# Patient Record
Sex: Female | Born: 1950 | Race: White | Hispanic: No | Marital: Married | Smoking: Never smoker
Health system: Southern US, Community
[De-identification: ages and names within clinical notes are randomized; demographics above are authoritative.]

## PROBLEM LIST (undated history)

## (undated) DIAGNOSIS — E785 Hyperlipidemia, unspecified: Secondary | ICD-10-CM

## (undated) DIAGNOSIS — I10 Essential (primary) hypertension: Secondary | ICD-10-CM

## (undated) DIAGNOSIS — E119 Type 2 diabetes mellitus without complications: Secondary | ICD-10-CM

## (undated) HISTORY — DX: Hyperlipidemia, unspecified: E78.5

## (undated) HISTORY — DX: Essential (primary) hypertension: I10

## (undated) HISTORY — DX: Type 2 diabetes mellitus without complications: E11.9

## (undated) HISTORY — PX: TUBAL LIGATION: SHX77

---

## 2006-12-07 LAB — HM COLONOSCOPY: HM Colonoscopy: NORMAL

## 2006-12-19 ENCOUNTER — Ambulatory Visit: Payer: Self-pay | Admitting: Internal Medicine

## 2006-12-28 ENCOUNTER — Ambulatory Visit: Payer: Self-pay | Admitting: Internal Medicine

## 2007-01-01 ENCOUNTER — Encounter: Admission: RE | Admit: 2007-01-01 | Discharge: 2007-01-01 | Payer: Self-pay | Admitting: Obstetrics and Gynecology

## 2007-05-09 LAB — HM DEXA SCAN

## 2010-01-06 LAB — HM PAP SMEAR: HM Pap smear: NORMAL

## 2010-01-06 LAB — HM MAMMOGRAPHY: HM Mammogram: NORMAL

## 2010-11-18 ENCOUNTER — Encounter: Payer: Self-pay | Admitting: Family Medicine

## 2010-11-18 DIAGNOSIS — E785 Hyperlipidemia, unspecified: Secondary | ICD-10-CM | POA: Insufficient documentation

## 2010-11-18 DIAGNOSIS — I10 Essential (primary) hypertension: Secondary | ICD-10-CM | POA: Insufficient documentation

## 2012-01-22 ENCOUNTER — Ambulatory Visit
Admission: RE | Admit: 2012-01-22 | Discharge: 2012-01-22 | Disposition: A | Discharge status: Home / Self Care | Payer: BC Managed Care – PPO | Source: Ambulatory Visit | Attending: Family Medicine | Admitting: Family Medicine

## 2012-01-22 ENCOUNTER — Other Ambulatory Visit: Payer: Self-pay | Admitting: Family Medicine

## 2012-01-22 DIAGNOSIS — M25561 Pain in right knee: Secondary | ICD-10-CM

## 2012-07-29 ENCOUNTER — Ambulatory Visit (INDEPENDENT_AMBULATORY_CARE_PROVIDER_SITE_OTHER): Payer: BC Managed Care – PPO | Admitting: Family Medicine

## 2012-07-29 ENCOUNTER — Encounter: Payer: Self-pay | Admitting: Family Medicine

## 2012-07-29 VITALS — BP 138/70 | HR 60 | Temp 98.2°F | Resp 18 | Wt 157.0 lb

## 2012-07-29 DIAGNOSIS — J209 Acute bronchitis, unspecified: Secondary | ICD-10-CM

## 2012-07-29 MED ORDER — AZITHROMYCIN 250 MG PO TABS: ORAL_TABLET | ORAL | Status: DC

## 2012-07-29 MED ORDER — PREDNISONE 20 MG PO TABS: ORAL_TABLET | ORAL | Status: DC

## 2012-07-29 NOTE — Progress Notes (Signed)
Subjective:     Patient ID: Katie Lutz, female   DOB: 01-21-1951, 62 y.o.   MRN: [id number]  HPI  One week of worsening cough, chest congestion, head congestion, and shortness of breath.  He denies fevers.  Cough is productive of yellow sputum.  She is now audibly wheezing and has developed pleurisy.  She denies hemoptysis.  Denies anginal chest pain.   She has no history of asthma or smoking. She denies sore throat or otalgia. Past Medical History  Diagnosis Date  . Hyperlipidemia   . Hypertension    Current Outpatient Prescriptions on File Prior to Visit  Medication Sig Dispense Refill  . atenolol (TENORMIN) 25 MG tablet Take 25 mg by mouth daily.        . calcium carbonate 200 MG capsule Take 250 mg by mouth daily.        . hydrochlorothiazide 25 MG tablet Take 25 mg by mouth daily.        . Multiple Vitamins-Minerals (MULTIVITAMIN,TX-MINERALS) tablet Take 1 tablet by mouth daily.        . pravastatin (PRAVACHOL) 20 MG tablet Take 20 mg by mouth daily.         No current facility-administered medications on file prior to visit.    Review of Systems  Constitutional: Positive for chills and fatigue. Negative for fever.  HENT: Positive for congestion, rhinorrhea and postnasal drip. Negative for ear pain, neck pain and neck stiffness.   Respiratory: Positive for cough, chest tightness, shortness of breath and wheezing.   Cardiovascular: Negative for chest pain.  Gastrointestinal: Negative.        Objective:   Physical Exam  Constitutional: She appears well-developed and well-nourished.  HENT:  Head: Normocephalic.  Right Ear: External ear normal.  Left Ear: External ear normal.  Mouth/Throat: Oropharynx is clear and moist.  Eyes: Conjunctivae and EOM are normal. Pupils are equal, round, and reactive to light.  Neck: Normal range of motion. Neck supple.  Cardiovascular: Normal rate, regular rhythm and normal heart sounds.   Pulmonary/Chest: Effort normal. She has  wheezes. She has no rales.  Abdominal: Soft. Bowel sounds are normal.       Assessment:     Acute bronchitis with reactive airway disease    Plan:     Acute bronchitis - Plan: azithromycin (ZITHROMAX Z-PAK) 250 MG tablet  take as directed.  Also gave her Ventolin 2 puffs inhaled every 4 hours when necessary wheezing or shortness of breath.Marland Kitchen  also gave her prednisone 20 mg tablets she is to take 3 tablets on days 1 and 2, 2 tablets on days 3 and 4, 1 tablet on days 5 and 6. Recheck in one week sooner if worse.

## 2012-08-21 ENCOUNTER — Telehealth: Payer: Self-pay | Admitting: Family Medicine

## 2012-08-21 MED ORDER — PRAVASTATIN SODIUM 20 MG PO TABS: 20.0000 mg | ORAL_TABLET | Freq: Every day | ORAL | Status: DC

## 2012-08-21 NOTE — Telephone Encounter (Signed)
Rx Refilled  

## 2012-09-30 ENCOUNTER — Telehealth: Payer: Self-pay | Admitting: Family Medicine

## 2012-10-01 MED ORDER — CELECOXIB 200 MG PO CAPS: 200.0000 mg | ORAL_CAPSULE | Freq: Every day | ORAL | Status: DC

## 2012-10-01 NOTE — Telephone Encounter (Signed)
Rx Refilled  

## 2012-10-07 ENCOUNTER — Telehealth: Payer: Self-pay | Admitting: Family Medicine

## 2012-10-07 MED ORDER — LOSARTAN POTASSIUM 50 MG PO TABS: 50.0000 mg | ORAL_TABLET | Freq: Every day | ORAL | Status: DC

## 2012-10-07 NOTE — Telephone Encounter (Signed)
Losartan 50mg  take one tablet by mouth qd #30 last refill 09/08/12

## 2012-10-07 NOTE — Telephone Encounter (Signed)
Rx Refilled  

## 2012-10-30 ENCOUNTER — Telehealth: Payer: Self-pay | Admitting: Family Medicine

## 2012-10-30 MED ORDER — CELECOXIB 200 MG PO CAPS: 200.0000 mg | ORAL_CAPSULE | Freq: Every day | ORAL | Status: DC

## 2012-10-30 NOTE — Telephone Encounter (Signed)
Rx Refilled  

## 2013-02-03 ENCOUNTER — Other Ambulatory Visit: Payer: Self-pay | Admitting: Family Medicine

## 2013-02-09 ENCOUNTER — Other Ambulatory Visit: Payer: Self-pay | Admitting: Family Medicine

## 2013-03-14 ENCOUNTER — Other Ambulatory Visit: Payer: Self-pay | Admitting: Nurse Practitioner

## 2013-04-17 ENCOUNTER — Other Ambulatory Visit: Payer: Self-pay | Admitting: Family Medicine

## 2013-04-26 ENCOUNTER — Other Ambulatory Visit: Payer: Self-pay | Admitting: Nurse Practitioner

## 2013-04-26 ENCOUNTER — Other Ambulatory Visit: Payer: Self-pay | Admitting: Family Medicine

## 2013-05-22 ENCOUNTER — Other Ambulatory Visit: Payer: Self-pay | Admitting: Family Medicine

## 2013-05-23 ENCOUNTER — Other Ambulatory Visit: Payer: Self-pay | Admitting: Family Medicine

## 2013-05-23 ENCOUNTER — Encounter: Payer: Self-pay | Admitting: Family Medicine

## 2013-05-23 NOTE — Telephone Encounter (Signed)
Medication refill for one time only.  Patient needs to be seen.  Letter sent for patient to call and schedule 

## 2013-06-19 ENCOUNTER — Encounter: Payer: Self-pay | Admitting: Family Medicine

## 2013-06-19 ENCOUNTER — Other Ambulatory Visit: Payer: Self-pay | Admitting: Family Medicine

## 2013-06-19 NOTE — Telephone Encounter (Signed)
Medication refill for one time only.  Patient needs to be seen.  Letter sent for patient to call and schedule 

## 2013-07-14 ENCOUNTER — Ambulatory Visit
Admission: RE | Admit: 2013-07-14 | Discharge: 2013-07-14 | Disposition: A | Discharge status: Home / Self Care | Payer: Medicare Other | Source: Ambulatory Visit | Attending: Family Medicine | Admitting: Family Medicine

## 2013-07-14 ENCOUNTER — Encounter: Payer: Self-pay | Admitting: Family Medicine

## 2013-07-14 ENCOUNTER — Ambulatory Visit (INDEPENDENT_AMBULATORY_CARE_PROVIDER_SITE_OTHER): Payer: Medicare Other | Admitting: Family Medicine

## 2013-07-14 VITALS — BP 128/72 | HR 60 | Temp 97.4°F | Resp 16 | Ht 60.0 in | Wt 156.0 lb

## 2013-07-14 DIAGNOSIS — R05 Cough: Secondary | ICD-10-CM

## 2013-07-14 DIAGNOSIS — R0989 Other specified symptoms and signs involving the circulatory and respiratory systems: Secondary | ICD-10-CM | POA: Diagnosis not present

## 2013-07-14 DIAGNOSIS — R059 Cough, unspecified: Secondary | ICD-10-CM

## 2013-07-14 DIAGNOSIS — I10 Essential (primary) hypertension: Secondary | ICD-10-CM | POA: Diagnosis not present

## 2013-07-14 DIAGNOSIS — R0609 Other forms of dyspnea: Secondary | ICD-10-CM | POA: Diagnosis not present

## 2013-07-14 DIAGNOSIS — E785 Hyperlipidemia, unspecified: Secondary | ICD-10-CM

## 2013-07-14 DIAGNOSIS — R053 Chronic cough: Secondary | ICD-10-CM

## 2013-07-14 LAB — CBC WITH DIFFERENTIAL/PLATELET
Basophils Absolute: 0.1 10*3/uL (ref 0.0–0.1)
Basophils Relative: 1 % (ref 0–1)
EOS PCT: 2 % (ref 0–5)
Eosinophils Absolute: 0.1 10*3/uL (ref 0.0–0.7)
HCT: 37.5 % (ref 36.0–46.0)
Hemoglobin: 12.7 g/dL (ref 12.0–15.0)
LYMPHS ABS: 1.6 10*3/uL (ref 0.7–4.0)
LYMPHS PCT: 32 % (ref 12–46)
MCH: 30.2 pg (ref 26.0–34.0)
MCHC: 33.9 g/dL (ref 30.0–36.0)
MCV: 89.3 fL (ref 78.0–100.0)
Monocytes Absolute: 0.5 10*3/uL (ref 0.1–1.0)
Monocytes Relative: 9 % (ref 3–12)
Neutro Abs: 2.8 10*3/uL (ref 1.7–7.7)
Neutrophils Relative %: 56 % (ref 43–77)
PLATELETS: 216 10*3/uL (ref 150–400)
RBC: 4.2 MIL/uL (ref 3.87–5.11)
RDW: 14.2 % (ref 11.5–15.5)
WBC: 5 10*3/uL (ref 4.0–10.5)

## 2013-07-14 LAB — COMPLETE METABOLIC PANEL WITH GFR
ALBUMIN: 4.3 g/dL (ref 3.5–5.2)
ALK PHOS: 40 U/L (ref 39–117)
ALT: 20 U/L (ref 0–35)
AST: 19 U/L (ref 0–37)
BUN: 22 mg/dL (ref 6–23)
CALCIUM: 10 mg/dL (ref 8.4–10.5)
CHLORIDE: 105 meq/L (ref 96–112)
CO2: 23 meq/L (ref 19–32)
Creat: 0.78 mg/dL (ref 0.50–1.10)
GFR, EST NON AFRICAN AMERICAN: 81 mL/min
GLUCOSE: 110 mg/dL — AB (ref 70–99)
POTASSIUM: 4.1 meq/L (ref 3.5–5.3)
SODIUM: 138 meq/L (ref 135–145)
TOTAL PROTEIN: 7.4 g/dL (ref 6.0–8.3)
Total Bilirubin: 0.5 mg/dL (ref 0.2–1.2)

## 2013-07-14 LAB — LIPID PANEL
CHOL/HDL RATIO: 3.1 ratio
Cholesterol: 188 mg/dL (ref 0–200)
HDL: 61 mg/dL (ref >39)
LDL Cholesterol: 102 mg/dL — ABNORMAL HIGH (ref 0–99)
Triglycerides: 127 mg/dL (ref <150)
VLDL: 25 mg/dL (ref 0–40)

## 2013-07-14 LAB — ANGIOTENSIN CONVERTING ENZYME: Angiotensin-Converting Enzyme: 43 U/L (ref 8–52)

## 2013-07-14 NOTE — Progress Notes (Signed)
Subjective:    Patient ID: Al Pimple, female    DOB: 06-Jul-1950, 63 y.o.   MRN: [id number]  HPI Patient presents today for recheck of her cholesterol as well as hypertension. She is fasting. However, I last saw the patient a year ago for asthmatic bronchitis. Since then she complains of persistent daily cough. She also complains of dyspnea on exertion. Cough is nonproductive. She denies hemoptysis. She is not losing weight. She occasionally reports wheezing. She has no history of asthma. She has no history of smoking or secondhand smoke exposure. She has no environmental exposures. She has no knee or allergies or allergy exposure. She denies postnasal drip. She denies reflux. She denies myalgias or right upper quadrant pain on her cholesterol medication. She denies any chest pain, chest pressure, or angina. Her blood pressure is well controlled at 128/72. Past Medical History  Diagnosis Date  . Hyperlipidemia   . Hypertension    Current Outpatient Prescriptions on File Prior to Visit  Medication Sig Dispense Refill  . atenolol (TENORMIN) 25 MG tablet Take 25 mg by mouth daily.        . calcium carbonate 200 MG capsule Take 250 mg by mouth daily.        . celecoxib (CELEBREX) 200 MG capsule Take 1 capsule (200 mg total) by mouth daily.  30 capsule  5  . FLUoxetine (PROZAC) 20 MG capsule TAKE ONE CAPSULE BY MOUTH DAILY  90 capsule  3  . hydrochlorothiazide (HYDRODIURIL) 25 MG tablet TAKE 1 TABLET BY MOUTH DAILY  90 tablet  1  . losartan (COZAAR) 50 MG tablet TAKE 1 TABLET BY MOUTH EVERY DAY  30 tablet  0  . Multiple Vitamins-Minerals (MULTIVITAMIN,TX-MINERALS) tablet Take 1 tablet by mouth daily.        . pravastatin (PRAVACHOL) 20 MG tablet TAKE 1 TABLET BY MOUTH EVERY DAY  30 tablet  0   No current facility-administered medications on file prior to visit.   No Known Allergies History   Social History  . Marital Status: Married    Spouse Name: N/A    Number of Children: N/A    . Years of Education: N/A   Occupational History  . Not on file.   Social History Main Topics  . Smoking status: Never Smoker   . Smokeless tobacco: Not on file  . Alcohol Use: Yes     Comment: glass wine once a week  . Drug Use: No  . Sexual Activity: Not on file   Other Topics Concern  . Not on file   Social History Narrative  . No narrative on file      Review of Systems  All other systems reviewed and are negative.       Objective:   Physical Exam  Vitals reviewed. Constitutional: She appears well-developed and well-nourished. No distress.  HENT:  Right Ear: External ear normal.  Left Ear: External ear normal.  Nose: Nose normal.  Mouth/Throat: Oropharynx is clear and moist. No oropharyngeal exudate.  Neck: Neck supple. No JVD present. No thyromegaly present.  Cardiovascular: Normal rate, regular rhythm and normal heart sounds.  Exam reveals no gallop and no friction rub.   No murmur heard. Pulmonary/Chest: Effort normal and breath sounds normal. No respiratory distress. She has no wheezes. She has no rales. She exhibits no tenderness.  Abdominal: Soft. Bowel sounds are normal. She exhibits no distension. There is no tenderness. There is no rebound and no guarding.  Musculoskeletal: She exhibits no  edema.  Lymphadenopathy:    She has no cervical adenopathy.  Skin: She is not diaphoretic.          Assessment & Plan:  1. Dyspnea on exertion Check a chest x-ray. Also given a half right symptoms are also check an ACE level for sarcoidosis. Patient's spirometry today shows an FEV1 of 2.1 cm which is 105% of predicted. She also has an FVC of 2.86 L which is 115% predicted. Her FEV1 to FEC ratio is 74% which is normal.  Chest x-ray lab work is normal I instructed patient on PPI for possible laryngopharyngeal reflux. Consider also obtaining a stress test. I also considered an empiric trial of Advair to see if her symptoms improve depending upon her response to  PPI. - DG Chest 2 View; Future - Angiotensin converting enzyme  2. HTN (hypertension) Blood pressures well controlled. - COMPLETE METABOLIC PANEL WITH GFR - Lipid panel - CBC with Differential  3. Chronic cough See problem #1  4. HLD (hyperlipidemia) Check CMP and fasting lipid panel. Goal LDL is less than 130.

## 2013-07-16 ENCOUNTER — Other Ambulatory Visit: Payer: Self-pay | Admitting: Family Medicine

## 2013-07-16 MED ORDER — PANTOPRAZOLE SODIUM 40 MG PO TBEC: 40.0000 mg | DELAYED_RELEASE_TABLET | Freq: Every day | ORAL | Status: DC

## 2013-07-16 MED ORDER — FLUTICASONE PROPIONATE 50 MCG/ACT NA SUSP: 2.0000 | Freq: Every day | NASAL | Status: DC

## 2013-07-17 ENCOUNTER — Other Ambulatory Visit: Payer: Self-pay | Admitting: Family Medicine

## 2013-07-20 DIAGNOSIS — Z23 Encounter for immunization: Secondary | ICD-10-CM | POA: Diagnosis not present

## 2013-07-30 ENCOUNTER — Other Ambulatory Visit: Payer: Self-pay | Admitting: Nurse Practitioner

## 2013-12-24 ENCOUNTER — Other Ambulatory Visit: Payer: Self-pay | Admitting: Family Medicine

## 2014-01-04 ENCOUNTER — Other Ambulatory Visit: Payer: Self-pay | Admitting: Family Medicine

## 2014-01-05 NOTE — Telephone Encounter (Signed)
Refill appropriate and filled per protocol. 

## 2014-01-07 DIAGNOSIS — Z23 Encounter for immunization: Secondary | ICD-10-CM | POA: Diagnosis not present

## 2014-01-23 ENCOUNTER — Other Ambulatory Visit: Payer: Self-pay | Admitting: Family Medicine

## 2014-01-23 ENCOUNTER — Other Ambulatory Visit: Payer: Self-pay | Admitting: Nurse Practitioner

## 2014-01-27 ENCOUNTER — Ambulatory Visit (INDEPENDENT_AMBULATORY_CARE_PROVIDER_SITE_OTHER): Payer: Medicare Other | Admitting: Family Medicine

## 2014-01-27 ENCOUNTER — Encounter: Payer: Self-pay | Admitting: Family Medicine

## 2014-01-27 VITALS — BP 128/78 | HR 60 | Temp 97.9°F | Resp 18 | Wt 158.0 lb

## 2014-01-27 DIAGNOSIS — M6530 Trigger finger, unspecified finger: Secondary | ICD-10-CM

## 2014-01-27 NOTE — Progress Notes (Signed)
   Subjective:    Patient ID: Katie Lutz, female    DOB: 1950-10-01, 63 y.o.   MRN: [id number]  HPI Patient has pain on the volar aspect of the left 4th MCP joint.   There is a locking sensation and pain with flex and ext on the mcp joint.   Past Medical History  Diagnosis Date  . Hyperlipidemia   . Hypertension    Current Outpatient Prescriptions on File Prior to Visit  Medication Sig Dispense Refill  . atenolol (TENORMIN) 25 MG tablet TAKE 1 TABLET BY MOUTH EVERY DAY  90 tablet  1  . calcium carbonate 200 MG capsule Take 250 mg by mouth daily.        Marland Kitchen FLUoxetine (PROZAC) 20 MG capsule TAKE ONE CAPSULE BY MOUTH EVERY DAY  90 capsule  3  . hydrochlorothiazide (HYDRODIURIL) 25 MG tablet TAKE 1 TABLET BY MOUTH EVERY DAY  90 tablet  0  . losartan (COZAAR) 50 MG tablet TAKE 1 TABLET BY MOUTH EVERY DAY  30 tablet  11  . Multiple Vitamins-Minerals (MULTIVITAMIN,TX-MINERALS) tablet Take 1 tablet by mouth daily.        . pravastatin (PRAVACHOL) 20 MG tablet TAKE 1 TABLET BY MOUTH EVERY DAY  30 tablet  11   No current facility-administered medications on file prior to visit.   No Known Allergies History   Social History  . Marital Status: Married    Spouse Name: N/A    Number of Children: N/A  . Years of Education: N/A   Occupational History  . Not on file.   Social History Main Topics  . Smoking status: Never Smoker   . Smokeless tobacco: Never Used  . Alcohol Use: Yes     Comment: glass wine once a week  . Drug Use: No  . Sexual Activity: Not on file   Other Topics Concern  . Not on file   Social History Narrative  . No narrative on file       Review of Systems  All other systems reviewed and are negative.      Objective:   Physical Exam  Vitals reviewed. Cardiovascular: Normal rate and regular rhythm.   Pulmonary/Chest: Effort normal and breath sounds normal.  small palpable subcutaneous nodule at the volar aspect of the 4th mcp joint.          Assessment & Plan:  Trigger finger, acquired  Using sterile technique, the trigger nodule was injected with 1/2 cc of lidocaine and 1/2 cc of 40 mg/mL kenalog.  Recheck in 2 weeks if no better or sooner if worse.

## 2014-02-04 ENCOUNTER — Other Ambulatory Visit: Payer: Self-pay | Admitting: Nurse Practitioner

## 2014-02-04 ENCOUNTER — Other Ambulatory Visit: Payer: Self-pay | Admitting: Family Medicine

## 2014-02-04 NOTE — Telephone Encounter (Signed)
Medication refilled per protocol. 

## 2014-03-28 ENCOUNTER — Other Ambulatory Visit: Payer: Self-pay | Admitting: Family Medicine

## 2014-03-31 ENCOUNTER — Other Ambulatory Visit: Payer: Self-pay | Admitting: Family Medicine

## 2014-08-03 ENCOUNTER — Other Ambulatory Visit: Payer: Self-pay | Admitting: Family Medicine

## 2014-08-03 ENCOUNTER — Encounter: Payer: Self-pay | Admitting: Family Medicine

## 2014-08-03 NOTE — Telephone Encounter (Signed)
Pt has not been seen in over one year for routine OV and fasting lab work. Medication refill for one time only.  Patient needs to be seen.  Letter sent for patient to call and schedule

## 2014-08-30 ENCOUNTER — Other Ambulatory Visit: Payer: Self-pay | Admitting: Family Medicine

## 2014-09-07 ENCOUNTER — Encounter: Payer: Self-pay | Admitting: Family Medicine

## 2014-09-07 ENCOUNTER — Ambulatory Visit (INDEPENDENT_AMBULATORY_CARE_PROVIDER_SITE_OTHER): Payer: Medicare Other | Admitting: Family Medicine

## 2014-09-07 VITALS — BP 118/68 | HR 69 | Temp 98.0°F | Resp 16 | Ht 60.0 in | Wt 159.0 lb

## 2014-09-07 DIAGNOSIS — J4520 Mild intermittent asthma, uncomplicated: Secondary | ICD-10-CM | POA: Diagnosis not present

## 2014-09-07 DIAGNOSIS — I10 Essential (primary) hypertension: Secondary | ICD-10-CM

## 2014-09-07 LAB — COMPLETE METABOLIC PANEL WITH GFR
ALT: 25 U/L (ref 0–35)
AST: 24 U/L (ref 0–37)
Albumin: 4 g/dL (ref 3.5–5.2)
Alkaline Phosphatase: 49 U/L (ref 39–117)
BILIRUBIN TOTAL: 0.4 mg/dL (ref 0.2–1.2)
BUN: 19 mg/dL (ref 6–23)
CALCIUM: 10.7 mg/dL — AB (ref 8.4–10.5)
CO2: 24 meq/L (ref 19–32)
Chloride: 104 mEq/L (ref 96–112)
Creat: 0.76 mg/dL (ref 0.50–1.10)
GFR, Est Non African American: 82 mL/min
Glucose, Bld: 115 mg/dL — ABNORMAL HIGH (ref 70–99)
Potassium: 4.7 mEq/L (ref 3.5–5.3)
Sodium: 139 mEq/L (ref 135–145)
Total Protein: 7.7 g/dL (ref 6.0–8.3)

## 2014-09-07 LAB — CBC WITH DIFFERENTIAL/PLATELET
BASOS ABS: 0 10*3/uL (ref 0.0–0.1)
Basophils Relative: 0 % (ref 0–1)
Eosinophils Absolute: 0.2 10*3/uL (ref 0.0–0.7)
Eosinophils Relative: 3 % (ref 0–5)
HCT: 36.2 % (ref 36.0–46.0)
Hemoglobin: 11.9 g/dL — ABNORMAL LOW (ref 12.0–15.0)
Lymphocytes Relative: 31 % (ref 12–46)
Lymphs Abs: 1.6 10*3/uL (ref 0.7–4.0)
MCH: 29.1 pg (ref 26.0–34.0)
MCHC: 32.9 g/dL (ref 30.0–36.0)
MCV: 88.5 fL (ref 78.0–100.0)
MPV: 8.3 fL — AB (ref 8.6–12.4)
Monocytes Absolute: 0.5 10*3/uL (ref 0.1–1.0)
Monocytes Relative: 9 % (ref 3–12)
NEUTROS PCT: 57 % (ref 43–77)
Neutro Abs: 2.9 10*3/uL (ref 1.7–7.7)
PLATELETS: 291 10*3/uL (ref 150–400)
RBC: 4.09 MIL/uL (ref 3.87–5.11)
RDW: 13.4 % (ref 11.5–15.5)
WBC: 5.1 10*3/uL (ref 4.0–10.5)

## 2014-09-07 LAB — LIPID PANEL
CHOL/HDL RATIO: 3.9 ratio
Cholesterol: 193 mg/dL (ref 0–200)
HDL: 49 mg/dL (ref >=46)
LDL Cholesterol: 106 mg/dL — ABNORMAL HIGH (ref 0–99)
TRIGLYCERIDES: 188 mg/dL — AB (ref <150)
VLDL: 38 mg/dL (ref 0–40)

## 2014-09-07 MED ORDER — PREDNISONE 20 MG PO TABS: ORAL_TABLET | ORAL | Status: DC

## 2014-09-07 MED ORDER — ALBUTEROL SULFATE HFA 108 (90 BASE) MCG/ACT IN AERS: 2.0000 | INHALATION_SPRAY | Freq: Four times a day (QID) | RESPIRATORY_TRACT | Status: DC | PRN

## 2014-09-07 NOTE — Progress Notes (Signed)
Subjective:    Patient ID: Katie Lutz, female    DOB: 10-03-1950, 64 y.o.   MRN: [id number]  HPI Patient is here today for recheck of her regular medical problems. She has a history of hypertension and hyperlipidemia. She is currently on a combination of atenolol, hydrochlorothiazide, and losartan for hypertension. Her blood pressure is well controlled at [place].  She is also due for fasting lipid panel. She is currently on pravastatin 20 mg a day. She denies any myalgias or right upper quadrant pain. However over the last week she reports progressive dyspnea on exertion, coughing that is unrelenting, severe wheezing. She denies any fevers. She denies any chills. She denies any productive cough. This was triggered by her allergies. This happened the exact same time last year and was also triggered by allergies then Past Medical History  Diagnosis Date  . Hyperlipidemia   . Hypertension    Past Surgical History  Procedure Laterality Date  . Tubal ligation     Current Outpatient Prescriptions on File Prior to Visit  Medication Sig Dispense Refill  . atenolol (TENORMIN) 25 MG tablet TAKE 1 TABLET BY MOUTH EVERY DAY 90 tablet 1  . calcium carbonate 200 MG capsule Take 250 mg by mouth daily.      Marland Kitchen FLUoxetine (PROZAC) 20 MG capsule TAKE ONE CAPSULE BY MOUTH EVERY DAY 90 capsule 3  . GLUCOSAMINE CHONDROITIN COMPLX PO Take 1 tablet by mouth 2 (two) times daily.    . hydrochlorothiazide (HYDRODIURIL) 25 MG tablet TAKE 1 TABLET BY MOUTH DAILY 90 tablet 1  . losartan (COZAAR) 50 MG tablet TAKE 1 TABLET BY MOUTH EVERY DAY 30 tablet 0  . Multiple Vitamins-Minerals (MULTIVITAMIN,TX-MINERALS) tablet Take 1 tablet by mouth daily.      Marland Kitchen omeprazole (PRILOSEC OTC) 20 MG tablet Take 20 mg by mouth daily.    . pravastatin (PRAVACHOL) 20 MG tablet TAKE 1 TABLET BY MOUTH EVERY DAY 30 tablet 0   No current facility-administered medications on file prior to visit.   No Known Allergies History    Social History  . Marital Status: Married    Spouse Name: N/A  . Number of Children: N/A  . Years of Education: N/A   Occupational History  . Not on file.   Social History Main Topics  . Smoking status: Never Smoker   . Smokeless tobacco: Never Used  . Alcohol Use: Yes     Comment: glass wine once a week  . Drug Use: No  . Sexual Activity: Not on file   Other Topics Concern  . Not on file   Social History Narrative      Review of Systems  All other systems reviewed and are negative.      Objective:   Physical Exam  Constitutional: She appears well-developed and well-nourished.  HENT:  Right Ear: External ear normal.  Left Ear: External ear normal.  Nose: Mucosal edema and rhinorrhea present.  Mouth/Throat: Oropharynx is clear and moist. No oropharyngeal exudate.  Neck: Neck supple.  Cardiovascular: Normal rate, regular rhythm and normal heart sounds.   Pulmonary/Chest: Effort normal. She has wheezes.  Lymphadenopathy:    She has no cervical adenopathy.  Vitals reviewed.         Assessment & Plan:  Asthmatic bronchitis, mild intermittent, uncomplicated - Plan: predniSONE (DELTASONE) 20 MG tablet, albuterol (PROVENTIL HFA;VENTOLIN HFA) 108 (90 BASE) MCG/ACT inhaler  Benign essential HTN - Plan: COMPLETE METABOLIC PANEL WITH GFR, CBC with Differential/Platelet, Lipid panel  Blood pressures well controlled. I'll make no changes in her blood pressure medication. I will check a fasting lipid panel. Goal LDL cholesterol is less than 130. Patient is having an asthma exacerbation. This tends to occur every year at the exact same time. I believe it is related to allergies. I will treat the exacerbation with a prednisone taper pack. Patient can also use albuterol 2 puffs inhaled every 6 hours as needed. Also recommended that she start taking Zyrtec 10 mg every day beginning in late February through June to help prevent this in the future.

## 2014-09-09 ENCOUNTER — Encounter: Payer: Self-pay | Admitting: Family Medicine

## 2014-09-11 ENCOUNTER — Telehealth: Payer: Self-pay | Admitting: Family Medicine

## 2014-09-11 ENCOUNTER — Ambulatory Visit (INDEPENDENT_AMBULATORY_CARE_PROVIDER_SITE_OTHER): Payer: Medicare Other | Admitting: Family Medicine

## 2014-09-11 ENCOUNTER — Encounter: Payer: Self-pay | Admitting: Family Medicine

## 2014-09-11 VITALS — BP 130/80 | HR 80 | Temp 97.9°F | Resp 24 | Ht 60.0 in | Wt 160.0 lb

## 2014-09-11 DIAGNOSIS — R0600 Dyspnea, unspecified: Secondary | ICD-10-CM | POA: Diagnosis not present

## 2014-09-11 DIAGNOSIS — R071 Chest pain on breathing: Secondary | ICD-10-CM | POA: Diagnosis not present

## 2014-09-11 DIAGNOSIS — R079 Chest pain, unspecified: Secondary | ICD-10-CM | POA: Diagnosis not present

## 2014-09-11 MED ORDER — HYDROCODONE-HOMATROPINE 5-1.5 MG/5ML PO SYRP: 5.0000 mL | ORAL_SOLUTION | Freq: Four times a day (QID) | ORAL | Status: DC | PRN

## 2014-09-11 MED ORDER — LEVOFLOXACIN 500 MG PO TABS: 500.0000 mg | ORAL_TABLET | Freq: Every day | ORAL | Status: DC

## 2014-09-11 NOTE — Progress Notes (Signed)
Subjective:    Patient ID: Katie Lutz, female    DOB: 1950-09-13, 64 y.o.   MRN: [id number]  HPI 09/07/14 Patient is here today for recheck of her regular medical problems. She has a history of hypertension and hyperlipidemia. She is currently on a combination of atenolol, hydrochlorothiazide, and losartan for hypertension. Her blood pressure is well controlled at [place].  She is also due for fasting lipid panel. She is currently on pravastatin 20 mg a day. She denies any myalgias or right upper quadrant pain. However over the last week she reports progressive dyspnea on exertion, coughing that is unrelenting, severe wheezing. She denies any fevers. She denies any chills. She denies any productive cough. This was triggered by her allergies. This happened the exact same time last year and was also triggered by allergies then.  At that time, my plan was: Blood pressures well controlled. I'll make no changes in her blood pressure medication. I will check a fasting lipid panel. Goal LDL cholesterol is less than 130. Patient is having an asthma exacerbation. This tends to occur every year at the exact same time. I believe it is related to allergies. I will treat the exacerbation with a prednisone taper pack. Patient can also use albuterol 2 puffs inhaled every 6 hours as needed. Also recommended that she start taking Zyrtec 10 mg every day beginning in late February through June to help prevent this in the future.  09/11/14 Patient is here today for follow-up. She tachypneic. She is hyperventilating. She states that she cannot stop coughing. She also reports a pressure on the center of her chest that she describes as feeling like a ton of bricks. She feels subjectively short of breath even though her pulse oximetry is 99% on room air.  On examination today her lungs are clear bilaterally. There is no wheezes crackles or rales to explain her shortness of breath. Differential diagnosis includes pneumonia,  pulmonary embolism, or a  cardiac source of chest pain. I will obtain a stat EKG.  Patient's cardiac risk factors include hypertension, hyperlipidemia, age, and the recent use of albuterol. She denies any recent plane flights or immobilization or hormone therapy to explain a pulmonary embolism. Furthermore there is no edema in her legs however she is labored in breathing. Furthermore she is complaining of tightness and pressure in her chest. However her pulmonary exam reveals no explanation for this. There is no obvious source of an asthma exacerbation. Therefore I will also obtain a CT angiogram to rule out pulmonary embolism and also evaluate to see if there is an underlying pneumonia. Past Medical History  Diagnosis Date  . Hyperlipidemia   . Hypertension    Past Surgical History  Procedure Laterality Date  . Tubal ligation     Current Outpatient Prescriptions on File Prior to Visit  Medication Sig Dispense Refill  . albuterol (PROVENTIL HFA;VENTOLIN HFA) 108 (90 BASE) MCG/ACT inhaler Inhale 2 puffs into the lungs every 6 (six) hours as needed for wheezing or shortness of breath. 1 Inhaler 0  . atenolol (TENORMIN) 25 MG tablet TAKE 1 TABLET BY MOUTH EVERY DAY 90 tablet 1  . calcium carbonate 200 MG capsule Take 250 mg by mouth daily.      Marland Kitchen FLUoxetine (PROZAC) 20 MG capsule TAKE ONE CAPSULE BY MOUTH EVERY DAY 90 capsule 3  . GLUCOSAMINE CHONDROITIN COMPLX PO Take 1 tablet by mouth 2 (two) times daily.    . hydrochlorothiazide (HYDRODIURIL) 25 MG tablet TAKE 1 TABLET  BY MOUTH DAILY 90 tablet 1  . losartan (COZAAR) 50 MG tablet TAKE 1 TABLET BY MOUTH EVERY DAY 30 tablet 0  . Multiple Vitamins-Minerals (MULTIVITAMIN,TX-MINERALS) tablet Take 1 tablet by mouth daily.      Marland Kitchen omeprazole (PRILOSEC OTC) 20 MG tablet Take 20 mg by mouth daily.    . pravastatin (PRAVACHOL) 20 MG tablet TAKE 1 TABLET BY MOUTH EVERY DAY 30 tablet 0  . predniSONE (DELTASONE) 20 MG tablet 3 tabs poqday 1-2, 2 tabs poqday  3-4, 1 tab poqday 5-6 12 tablet 0   No current facility-administered medications on file prior to visit.   No Known Allergies History   Social History  . Marital Status: Married    Spouse Name: N/A  . Number of Children: N/A  . Years of Education: N/A   Occupational History  . Not on file.   Social History Main Topics  . Smoking status: Never Smoker   . Smokeless tobacco: Never Used  . Alcohol Use: Yes     Comment: glass wine once a week  . Drug Use: No  . Sexual Activity: Not on file   Other Topics Concern  . Not on file   Social History Narrative      Review of Systems  All other systems reviewed and are negative.      Objective:   Physical Exam  Constitutional: She appears well-developed and well-nourished.  HENT:  Right Ear: External ear normal.  Left Ear: External ear normal.  Nose: Mucosal edema and rhinorrhea present.  Mouth/Throat: Oropharynx is clear and moist. No oropharyngeal exudate.  Neck: Neck supple.  Cardiovascular: Normal rate, regular rhythm and normal heart sounds.   Pulmonary/Chest: Accessory muscle usage present. Tachypnea noted. She has no decreased breath sounds. She has no wheezes. She has no rhonchi. She has no rales.  Lymphadenopathy:    She has no cervical adenopathy.  Vitals reviewed.         Assessment & Plan:  Chest pain on breathing - Plan: CT Angio Chest PE W/Cm &/Or Wo Cm  EKG shows T-wave inversions in V1 V2 and V3. I have no previous EKG to compare to. Otherwise EKG shows nonspecific ST changes in the lateral leads. There is no evidence of overt ischemia or infarction. I believe the likelihood of cardiac source based on this EKG is unlikely. Therefore differential diagnosis includes anxiety attack, asthma exacerbation although her pulmonary exam is completely normal, pulmonary embolism, or pneumonia. I will give the patient a DuoNeb right now to see if there is any symptomatic relief. If not I will send the patient for a  CT angiogram today at 1:30. If the CT angiogram is negative I will treat the patient as infective bronchitis/walking pneumonia with antibiotics and cough medication.  CT angiogram reveals no evidence of pulmonary edema. I will treat the patient for bronchitis with Levaquin 500 mg by mouth daily for 7 days and also give her Hycodan 1 teaspoon every 6 hours as needed for cough. Recheck next week. Seek medical attention immediately if worse.

## 2014-09-11 NOTE — Telephone Encounter (Signed)
Provider rec'd CT-CTA of chest from outside imaging service.  NO PE noted.  Pt made aware.  Levaquin 500 mg qd x 7 days to pharmacy.  Pt to come by office and pick up RX for Hycodan cough medicine.

## 2014-09-22 ENCOUNTER — Other Ambulatory Visit: Payer: Self-pay | Admitting: Family Medicine

## 2014-09-22 NOTE — Telephone Encounter (Signed)
Medication refill per protocol °

## 2014-09-27 ENCOUNTER — Other Ambulatory Visit: Payer: Self-pay | Admitting: Family Medicine

## 2014-10-02 ENCOUNTER — Encounter: Payer: Self-pay | Admitting: Family Medicine

## 2014-11-16 ENCOUNTER — Other Ambulatory Visit: Payer: Self-pay | Admitting: Family Medicine

## 2014-11-16 MED ORDER — ATENOLOL 25 MG PO TABS: 25.0000 mg | ORAL_TABLET | Freq: Every day | ORAL | Status: DC

## 2014-11-16 NOTE — Telephone Encounter (Signed)
Medication refilled per protocol. 

## 2014-11-16 NOTE — Telephone Encounter (Signed)
Duplicate request

## 2014-12-25 ENCOUNTER — Other Ambulatory Visit: Payer: Self-pay | Admitting: Family Medicine

## 2014-12-25 NOTE — Telephone Encounter (Signed)
Medication refilled per protocol. 

## 2014-12-28 DIAGNOSIS — Z1231 Encounter for screening mammogram for malignant neoplasm of breast: Secondary | ICD-10-CM | POA: Diagnosis not present

## 2014-12-28 LAB — HM MAMMOGRAPHY

## 2015-01-08 ENCOUNTER — Encounter: Payer: Self-pay | Admitting: Family Medicine

## 2015-02-22 ENCOUNTER — Ambulatory Visit (INDEPENDENT_AMBULATORY_CARE_PROVIDER_SITE_OTHER): Payer: Medicare Other | Admitting: Family Medicine

## 2015-02-22 ENCOUNTER — Encounter: Payer: Self-pay | Admitting: Family Medicine

## 2015-02-22 VITALS — BP 124/70 | HR 64 | Temp 98.2°F | Resp 16 | Ht 60.0 in | Wt 160.0 lb

## 2015-02-22 DIAGNOSIS — E785 Hyperlipidemia, unspecified: Secondary | ICD-10-CM

## 2015-02-22 DIAGNOSIS — I10 Essential (primary) hypertension: Secondary | ICD-10-CM

## 2015-02-22 DIAGNOSIS — R7303 Prediabetes: Secondary | ICD-10-CM | POA: Diagnosis not present

## 2015-02-22 DIAGNOSIS — Z23 Encounter for immunization: Secondary | ICD-10-CM | POA: Diagnosis not present

## 2015-02-22 DIAGNOSIS — R7309 Other abnormal glucose: Secondary | ICD-10-CM | POA: Diagnosis not present

## 2015-02-22 LAB — COMPLETE METABOLIC PANEL WITH GFR
ALT: 39 U/L — ABNORMAL HIGH (ref 6–29)
AST: 31 U/L (ref 10–35)
Albumin: 4.2 g/dL (ref 3.6–5.1)
Alkaline Phosphatase: 45 U/L (ref 33–130)
BILIRUBIN TOTAL: 0.5 mg/dL (ref 0.2–1.2)
BUN: 19 mg/dL (ref 7–25)
CO2: 24 mmol/L (ref 20–31)
Calcium: 10.4 mg/dL (ref 8.6–10.4)
Chloride: 104 mmol/L (ref 98–110)
Creat: 0.72 mg/dL (ref 0.50–0.99)
GFR, Est Non African American: 88 mL/min (ref >=60)
Glucose, Bld: 112 mg/dL — ABNORMAL HIGH (ref 70–99)
POTASSIUM: 4.5 mmol/L (ref 3.5–5.3)
Sodium: 139 mmol/L (ref 135–146)
TOTAL PROTEIN: 8 g/dL (ref 6.1–8.1)

## 2015-02-22 LAB — HEMOGLOBIN A1C
Hgb A1c MFr Bld: 6.6 % — ABNORMAL HIGH (ref <5.7)
Mean Plasma Glucose: 143 mg/dL — ABNORMAL HIGH (ref <117)

## 2015-02-22 LAB — LIPID PANEL
CHOL/HDL RATIO: 3.5 ratio (ref <=5.0)
Cholesterol: 194 mg/dL (ref 125–200)
HDL: 56 mg/dL (ref >=46)
LDL Cholesterol: 115 mg/dL (ref <130)
Triglycerides: 115 mg/dL (ref <150)
VLDL: 23 mg/dL (ref <30)

## 2015-02-22 MED ORDER — PRAVASTATIN SODIUM 20 MG PO TABS: 20.0000 mg | ORAL_TABLET | Freq: Every day | ORAL | Status: DC

## 2015-02-22 MED ORDER — HYDROCHLOROTHIAZIDE 25 MG PO TABS: 25.0000 mg | ORAL_TABLET | Freq: Every day | ORAL | Status: DC

## 2015-02-22 MED ORDER — FLUOXETINE HCL 20 MG PO CAPS: ORAL_CAPSULE | ORAL | Status: DC

## 2015-02-22 MED ORDER — LOSARTAN POTASSIUM 50 MG PO TABS: 50.0000 mg | ORAL_TABLET | Freq: Every day | ORAL | Status: DC

## 2015-02-22 MED ORDER — ATENOLOL 25 MG PO TABS: 25.0000 mg | ORAL_TABLET | Freq: Every day | ORAL | Status: DC

## 2015-02-22 NOTE — Progress Notes (Signed)
Subjective:    Patient ID: Katie Lutz, female    DOB: 23-Sep-1950, 64 y.o.   MRN: [id number]  HPI Patient is here today for follow-up of her chronic medical conditions. She has a history of hypertension as well as hyperlipidemia. When I last saw the patient in May, she had a fasting blood sugar of 115. She denies any polyuria, polydipsia, or blurred vision. She denies any numbness or tingling in her feet. She has no history of prediabetes however. She is due to recheck her blood sugar. She denies any chest pain shortness of breath or dyspnea on exertion. She denies any myalgias or right upper quadrant pain. She is due for a flu shot. She is also due for Pneumovax 23 Past Medical History  Diagnosis Date  . Hyperlipidemia   . Hypertension    Past Surgical History  Procedure Laterality Date  . Tubal ligation     Current Outpatient Prescriptions on File Prior to Visit  Medication Sig Dispense Refill  . albuterol (PROVENTIL HFA;VENTOLIN HFA) 108 (90 BASE) MCG/ACT inhaler Inhale 2 puffs into the lungs every 6 (six) hours as needed for wheezing or shortness of breath. 1 Inhaler 0  . calcium carbonate 200 MG capsule Take 250 mg by mouth daily.      Marland Kitchen GLUCOSAMINE CHONDROITIN COMPLX PO Take 1 tablet by mouth 2 (two) times daily.    . Multiple Vitamins-Minerals (MULTIVITAMIN,TX-MINERALS) tablet Take 1 tablet by mouth daily.      Marland Kitchen omeprazole (PRILOSEC OTC) 20 MG tablet Take 20 mg by mouth daily.    Marland Kitchen HYDROcodone-homatropine (HYCODAN) 5-1.5 MG/5ML syrup Take 5 mLs by mouth every 6 (six) hours as needed for cough. (Patient not taking: Reported on 02/22/2015) 120 mL 0   No current facility-administered medications on file prior to visit.   No Known Allergies Social History   Social History  . Marital Status: Married    Spouse Name: N/A  . Number of Children: N/A  . Years of Education: N/A   Occupational History  . Not on file.   Social History Main Topics  . Smoking status: Never  Smoker   . Smokeless tobacco: Never Used  . Alcohol Use: Yes     Comment: glass wine once a week  . Drug Use: No  . Sexual Activity: Not on file   Other Topics Concern  . Not on file   Social History Narrative      Review of Systems  All other systems reviewed and are negative.      Objective:   Physical Exam  Neck: No JVD present. No thyromegaly present.  Cardiovascular: Normal rate, regular rhythm and normal heart sounds.   No murmur heard. Pulmonary/Chest: Effort normal and breath sounds normal. No respiratory distress. She has no wheezes. She has no rales.  Abdominal: Soft. Bowel sounds are normal. She exhibits no distension. There is no tenderness. There is no rebound and no guarding.  Musculoskeletal: She exhibits no edema.  Lymphadenopathy:    She has no cervical adenopathy.  Vitals reviewed.         Assessment & Plan:  Prediabetes - Plan: COMPLETE METABOLIC PANEL WITH GFR, Lipid panel, Hemoglobin A1c  Benign essential HTN  HLD (hyperlipidemia)  Patient's blood pressure is outstanding. I'll make no changes in her medication at this time. I will check a fasting lipid panel. Given her elevated blood sugars, I would like her LDL cholesterol be below 100. She received her flu shot today as well as  Pneumovax 23. I will also check a hemoglobin A1c. We discussed a low carbohydrate diet. The patient would like to wean off Prozac. We discussed how to wean off gradually over a period of 1-2 months.

## 2015-02-22 NOTE — Addendum Note (Signed)
Addended by: Shary Decamp B on: 02/22/2015 11:40 AM   Modules accepted: Orders

## 2015-02-23 ENCOUNTER — Encounter: Payer: Self-pay | Admitting: Family Medicine

## 2015-03-01 ENCOUNTER — Ambulatory Visit: Payer: Medicare Other | Admitting: Family Medicine

## 2015-06-11 DIAGNOSIS — H2511 Age-related nuclear cataract, right eye: Secondary | ICD-10-CM | POA: Diagnosis not present

## 2015-06-11 DIAGNOSIS — H26492 Other secondary cataract, left eye: Secondary | ICD-10-CM | POA: Diagnosis not present

## 2015-09-28 ENCOUNTER — Encounter: Payer: Self-pay | Admitting: Family Medicine

## 2015-09-28 ENCOUNTER — Ambulatory Visit (INDEPENDENT_AMBULATORY_CARE_PROVIDER_SITE_OTHER): Payer: Medicare Other | Admitting: Family Medicine

## 2015-09-28 VITALS — BP 170/90 | HR 60 | Temp 98.0°F | Resp 18 | Wt 161.0 lb

## 2015-09-28 DIAGNOSIS — R7302 Impaired glucose tolerance (oral): Secondary | ICD-10-CM

## 2015-09-28 DIAGNOSIS — E785 Hyperlipidemia, unspecified: Secondary | ICD-10-CM | POA: Diagnosis not present

## 2015-09-28 DIAGNOSIS — I10 Essential (primary) hypertension: Secondary | ICD-10-CM

## 2015-09-28 DIAGNOSIS — J209 Acute bronchitis, unspecified: Secondary | ICD-10-CM

## 2015-09-28 LAB — CBC WITH DIFFERENTIAL/PLATELET
BASOS PCT: 0 %
Basophils Absolute: 0 cells/uL (ref 0–200)
EOS PCT: 4 %
Eosinophils Absolute: 276 cells/uL (ref 15–500)
HCT: 34.1 % — ABNORMAL LOW (ref 35.0–45.0)
Hemoglobin: 11.3 g/dL — ABNORMAL LOW (ref 12.0–15.0)
LYMPHS ABS: 1932 {cells}/uL (ref 850–3900)
LYMPHS PCT: 28 %
MCH: 29 pg (ref 27.0–33.0)
MCHC: 33.1 g/dL (ref 32.0–36.0)
MCV: 87.7 fL (ref 80.0–100.0)
MPV: 8.3 fL (ref 7.5–12.5)
Monocytes Absolute: 690 cells/uL (ref 200–950)
Monocytes Relative: 10 %
NEUTROS PCT: 58 %
Neutro Abs: 4002 cells/uL (ref 1500–7800)
Platelets: 248 10*3/uL (ref 140–400)
RBC: 3.89 MIL/uL (ref 3.80–5.10)
RDW: 15.3 % — AB (ref 11.0–15.0)
WBC: 6.9 10*3/uL (ref 3.8–10.8)

## 2015-09-28 LAB — HEMOGLOBIN A1C
Hgb A1c MFr Bld: 6.5 % — ABNORMAL HIGH (ref <5.7)
Mean Plasma Glucose: 140 mg/dL

## 2015-09-28 LAB — LIPID PANEL
CHOL/HDL RATIO: 4.2 ratio (ref <=5.0)
Cholesterol: 189 mg/dL (ref 125–200)
HDL: 45 mg/dL — ABNORMAL LOW (ref >=46)
LDL CALC: 97 mg/dL (ref <130)
Triglycerides: 233 mg/dL — ABNORMAL HIGH (ref <150)
VLDL: 47 mg/dL — AB (ref <30)

## 2015-09-28 LAB — COMPREHENSIVE METABOLIC PANEL
ALT: 20 U/L (ref 6–29)
AST: 19 U/L (ref 10–35)
Albumin: 3.7 g/dL (ref 3.6–5.1)
Alkaline Phosphatase: 42 U/L (ref 33–130)
BILIRUBIN TOTAL: 0.4 mg/dL (ref 0.2–1.2)
BUN: 19 mg/dL (ref 7–25)
CO2: 22 mmol/L (ref 20–31)
CREATININE: 0.72 mg/dL (ref 0.50–0.99)
Calcium: 10 mg/dL (ref 8.6–10.4)
Chloride: 109 mmol/L (ref 98–110)
GLUCOSE: 101 mg/dL — AB (ref 70–99)
Potassium: 4 mmol/L (ref 3.5–5.3)
SODIUM: 141 mmol/L (ref 135–146)
Total Protein: 6.6 g/dL (ref 6.1–8.1)

## 2015-09-28 MED ORDER — ALBUTEROL SULFATE HFA 108 (90 BASE) MCG/ACT IN AERS: 2.0000 | INHALATION_SPRAY | Freq: Four times a day (QID) | RESPIRATORY_TRACT | Status: DC | PRN

## 2015-09-28 MED ORDER — HYDROCODONE-HOMATROPINE 5-1.5 MG/5ML PO SYRP: 5.0000 mL | ORAL_SOLUTION | Freq: Four times a day (QID) | ORAL | Status: DC | PRN

## 2015-09-28 MED ORDER — AZITHROMYCIN 250 MG PO TABS: ORAL_TABLET | ORAL | Status: DC

## 2015-09-28 NOTE — Assessment & Plan Note (Signed)
Typically controlled she has not taken any of her medication today.

## 2015-09-28 NOTE — Assessment & Plan Note (Signed)
Elevated A1c she is best to work on dietary changes to recheck her labs today

## 2015-09-28 NOTE — Patient Instructions (Signed)
We will call with lab results Take cough medicine, antibiotics, albuterol as needed F/U Dr. Dennard Schaumann 2-3 months

## 2015-09-28 NOTE — Progress Notes (Signed)
Patient ID: Katie Lutz, female   DOB: 12-18-1950, 65 y.o.   MRN: [id number]    Subjective:    Patient ID: Katie Lutz, female    DOB: 1951-04-18, 65 y.o.   MRN: [id number]  Patient presents for sick x 1 1/2 weeks  here with cough nonproductive worsening over the past 10 days. Initially started with allergies and sneezing sore throat she took some Zyrtec but it is now progressing into her chest. Feels the same as she did last year when she had bronchitis. She's had some wheezing and some chest tightness. No fever. She is not taking anything else over-the-counter. No she also has hypertension and glucose intolerance her last A1c was at 6.6% she not take her blood pressure medications today she is fasting for her repeat labs for her blood sugar cholesterol and kidney function.  She is a nonsmoker    Review Of Systems:  GEN- denies fatigue, fever, weight loss,weakness, recent illness HEENT- denies eye drainage, change in vision, +nasal discharge, CVS- denies chest pain, palpitations RESP- denies SOB,+ cough,+ wheeze ABD- denies N/V, change in stools, abd pain GU- denies dysuria, hematuria, dribbling, incontinence MSK- denies joint pain, muscle aches, injury Neuro- denies headache, dizziness, syncope, seizure activity       Objective:    BP 170/90 mmHg  Pulse 60  Temp(Src) 98 F (36.7 C) (Oral)  Resp 18  Wt 161 lb (73.029 kg) GEN- NAD, alert and oriented x3 HEENT- PERRL, EOMI, non injected sclera, pink conjunctiva, MMM, oropharynx mild injection, TM clear bilat no effusion,  no maxillary sinus tenderness,clear Nasal drainage  Neck- Supple, + shotty ant  LAD CVS- RRR, no murmur RESP-congestion right lower lung, clears some with cough, no wheeze, normal WOB EXT- No edema Pulses- Radial 2+        Assessment & Plan:      Problem List Items Addressed This Visit    Hypertension    Typically controlled she has not taken any of her medication today.      Relevant  Orders   CBC with Differential/Platelet   Comprehensive metabolic panel   Hyperlipidemia   Relevant Orders   Lipid panel   Glucose intolerance (impaired glucose tolerance)    Elevated A1c she is best to work on dietary changes to recheck her labs today      Relevant Orders   Hemoglobin A1c    Other Visit Diagnoses    Acute bronchitis, unspecified organism    -  Primary    Treat with zpak with congestion heard right base, albuterol cough med, continue zyrtec, I think allergies proceeded with post nasal drip discussed nasal steroid    Relevant Medications    albuterol (PROVENTIL HFA;VENTOLIN HFA) 108 (90 Base) MCG/ACT inhaler       Note: This dictation was prepared with Dragon dictation along with smaller phrase technology. Any transcriptional errors that result from this process are unintentional.

## 2015-10-01 ENCOUNTER — Ambulatory Visit: Payer: Medicare Other | Admitting: Family Medicine

## 2015-10-18 DIAGNOSIS — M170 Bilateral primary osteoarthritis of knee: Secondary | ICD-10-CM | POA: Diagnosis not present

## 2015-10-18 DIAGNOSIS — R262 Difficulty in walking, not elsewhere classified: Secondary | ICD-10-CM | POA: Diagnosis not present

## 2015-10-18 DIAGNOSIS — M25561 Pain in right knee: Secondary | ICD-10-CM | POA: Diagnosis not present

## 2015-10-18 DIAGNOSIS — M25562 Pain in left knee: Secondary | ICD-10-CM | POA: Diagnosis not present

## 2015-10-25 DIAGNOSIS — R2689 Other abnormalities of gait and mobility: Secondary | ICD-10-CM | POA: Diagnosis not present

## 2015-10-25 DIAGNOSIS — M1712 Unilateral primary osteoarthritis, left knee: Secondary | ICD-10-CM | POA: Diagnosis not present

## 2015-10-25 DIAGNOSIS — M25561 Pain in right knee: Secondary | ICD-10-CM | POA: Diagnosis not present

## 2015-10-25 DIAGNOSIS — M170 Bilateral primary osteoarthritis of knee: Secondary | ICD-10-CM | POA: Diagnosis not present

## 2015-10-25 DIAGNOSIS — M25562 Pain in left knee: Secondary | ICD-10-CM | POA: Diagnosis not present

## 2015-10-27 DIAGNOSIS — M1711 Unilateral primary osteoarthritis, right knee: Secondary | ICD-10-CM | POA: Diagnosis not present

## 2015-10-27 DIAGNOSIS — M170 Bilateral primary osteoarthritis of knee: Secondary | ICD-10-CM | POA: Diagnosis not present

## 2015-10-27 DIAGNOSIS — M25561 Pain in right knee: Secondary | ICD-10-CM | POA: Diagnosis not present

## 2015-10-27 DIAGNOSIS — M25562 Pain in left knee: Secondary | ICD-10-CM | POA: Diagnosis not present

## 2015-10-27 DIAGNOSIS — R2689 Other abnormalities of gait and mobility: Secondary | ICD-10-CM | POA: Diagnosis not present

## 2015-11-01 DIAGNOSIS — M1712 Unilateral primary osteoarthritis, left knee: Secondary | ICD-10-CM | POA: Diagnosis not present

## 2015-11-01 DIAGNOSIS — M25562 Pain in left knee: Secondary | ICD-10-CM | POA: Diagnosis not present

## 2015-11-01 DIAGNOSIS — M170 Bilateral primary osteoarthritis of knee: Secondary | ICD-10-CM | POA: Diagnosis not present

## 2015-11-01 DIAGNOSIS — M25561 Pain in right knee: Secondary | ICD-10-CM | POA: Diagnosis not present

## 2015-11-01 DIAGNOSIS — R2689 Other abnormalities of gait and mobility: Secondary | ICD-10-CM | POA: Diagnosis not present

## 2015-11-03 DIAGNOSIS — M1711 Unilateral primary osteoarthritis, right knee: Secondary | ICD-10-CM | POA: Diagnosis not present

## 2015-11-03 DIAGNOSIS — M25561 Pain in right knee: Secondary | ICD-10-CM | POA: Diagnosis not present

## 2015-11-03 DIAGNOSIS — M170 Bilateral primary osteoarthritis of knee: Secondary | ICD-10-CM | POA: Diagnosis not present

## 2015-11-03 DIAGNOSIS — M25562 Pain in left knee: Secondary | ICD-10-CM | POA: Diagnosis not present

## 2015-11-03 DIAGNOSIS — R2689 Other abnormalities of gait and mobility: Secondary | ICD-10-CM | POA: Diagnosis not present

## 2015-11-12 DIAGNOSIS — M25561 Pain in right knee: Secondary | ICD-10-CM | POA: Diagnosis not present

## 2015-11-12 DIAGNOSIS — R2689 Other abnormalities of gait and mobility: Secondary | ICD-10-CM | POA: Diagnosis not present

## 2015-11-12 DIAGNOSIS — M1712 Unilateral primary osteoarthritis, left knee: Secondary | ICD-10-CM | POA: Diagnosis not present

## 2015-11-12 DIAGNOSIS — M170 Bilateral primary osteoarthritis of knee: Secondary | ICD-10-CM | POA: Diagnosis not present

## 2015-11-12 DIAGNOSIS — M25562 Pain in left knee: Secondary | ICD-10-CM | POA: Diagnosis not present

## 2015-11-16 DIAGNOSIS — M1711 Unilateral primary osteoarthritis, right knee: Secondary | ICD-10-CM | POA: Diagnosis not present

## 2015-11-16 DIAGNOSIS — M25561 Pain in right knee: Secondary | ICD-10-CM | POA: Diagnosis not present

## 2015-11-16 DIAGNOSIS — R2689 Other abnormalities of gait and mobility: Secondary | ICD-10-CM | POA: Diagnosis not present

## 2015-11-16 DIAGNOSIS — M25562 Pain in left knee: Secondary | ICD-10-CM | POA: Diagnosis not present

## 2015-11-16 DIAGNOSIS — M170 Bilateral primary osteoarthritis of knee: Secondary | ICD-10-CM | POA: Diagnosis not present

## 2015-11-22 DIAGNOSIS — M25561 Pain in right knee: Secondary | ICD-10-CM | POA: Diagnosis not present

## 2015-11-22 DIAGNOSIS — R2689 Other abnormalities of gait and mobility: Secondary | ICD-10-CM | POA: Diagnosis not present

## 2015-11-22 DIAGNOSIS — M25562 Pain in left knee: Secondary | ICD-10-CM | POA: Diagnosis not present

## 2015-11-22 DIAGNOSIS — M170 Bilateral primary osteoarthritis of knee: Secondary | ICD-10-CM | POA: Diagnosis not present

## 2015-12-01 DIAGNOSIS — M238X2 Other internal derangements of left knee: Secondary | ICD-10-CM | POA: Diagnosis not present

## 2015-12-01 DIAGNOSIS — M25562 Pain in left knee: Secondary | ICD-10-CM | POA: Diagnosis not present

## 2015-12-11 DIAGNOSIS — M238X2 Other internal derangements of left knee: Secondary | ICD-10-CM | POA: Diagnosis not present

## 2015-12-22 DIAGNOSIS — M1710 Unilateral primary osteoarthritis, unspecified knee: Secondary | ICD-10-CM | POA: Diagnosis not present

## 2015-12-22 DIAGNOSIS — Q686 Discoid meniscus: Secondary | ICD-10-CM | POA: Diagnosis not present

## 2015-12-22 DIAGNOSIS — M238X2 Other internal derangements of left knee: Secondary | ICD-10-CM | POA: Diagnosis not present

## 2015-12-27 DIAGNOSIS — Z1231 Encounter for screening mammogram for malignant neoplasm of breast: Secondary | ICD-10-CM | POA: Diagnosis not present

## 2015-12-27 LAB — HM MAMMOGRAPHY

## 2016-01-31 ENCOUNTER — Other Ambulatory Visit: Payer: Self-pay | Admitting: Family Medicine

## 2016-02-24 ENCOUNTER — Encounter: Payer: Self-pay | Admitting: Family Medicine

## 2016-03-14 DIAGNOSIS — M25562 Pain in left knee: Secondary | ICD-10-CM | POA: Diagnosis not present

## 2016-03-14 DIAGNOSIS — M170 Bilateral primary osteoarthritis of knee: Secondary | ICD-10-CM | POA: Diagnosis not present

## 2016-03-14 DIAGNOSIS — M25561 Pain in right knee: Secondary | ICD-10-CM | POA: Diagnosis not present

## 2016-03-14 DIAGNOSIS — Z23 Encounter for immunization: Secondary | ICD-10-CM | POA: Diagnosis not present

## 2016-03-22 ENCOUNTER — Other Ambulatory Visit: Payer: Self-pay | Admitting: Family Medicine

## 2016-07-03 ENCOUNTER — Ambulatory Visit: Payer: Medicare Other | Admitting: Family Medicine

## 2016-07-20 ENCOUNTER — Ambulatory Visit: Payer: Medicare Other | Admitting: Family Medicine

## 2016-08-10 DIAGNOSIS — M170 Bilateral primary osteoarthritis of knee: Secondary | ICD-10-CM | POA: Diagnosis not present

## 2016-08-10 DIAGNOSIS — M25561 Pain in right knee: Secondary | ICD-10-CM | POA: Diagnosis not present

## 2016-08-10 DIAGNOSIS — M1712 Unilateral primary osteoarthritis, left knee: Secondary | ICD-10-CM | POA: Diagnosis not present

## 2016-08-10 DIAGNOSIS — M25562 Pain in left knee: Secondary | ICD-10-CM | POA: Diagnosis not present

## 2016-08-16 DIAGNOSIS — M1712 Unilateral primary osteoarthritis, left knee: Secondary | ICD-10-CM | POA: Diagnosis not present

## 2016-08-16 DIAGNOSIS — M25562 Pain in left knee: Secondary | ICD-10-CM | POA: Diagnosis not present

## 2016-08-23 DIAGNOSIS — M25562 Pain in left knee: Secondary | ICD-10-CM | POA: Diagnosis not present

## 2016-08-23 DIAGNOSIS — M1712 Unilateral primary osteoarthritis, left knee: Secondary | ICD-10-CM | POA: Diagnosis not present

## 2016-09-06 DIAGNOSIS — M25562 Pain in left knee: Secondary | ICD-10-CM | POA: Diagnosis not present

## 2016-09-06 DIAGNOSIS — M1712 Unilateral primary osteoarthritis, left knee: Secondary | ICD-10-CM | POA: Diagnosis not present

## 2016-09-12 DIAGNOSIS — M25562 Pain in left knee: Secondary | ICD-10-CM | POA: Diagnosis not present

## 2016-09-12 DIAGNOSIS — M1712 Unilateral primary osteoarthritis, left knee: Secondary | ICD-10-CM | POA: Diagnosis not present

## 2016-09-28 DIAGNOSIS — H2511 Age-related nuclear cataract, right eye: Secondary | ICD-10-CM | POA: Diagnosis not present

## 2016-11-15 DIAGNOSIS — M1712 Unilateral primary osteoarthritis, left knee: Secondary | ICD-10-CM | POA: Diagnosis not present

## 2016-11-16 ENCOUNTER — Ambulatory Visit (INDEPENDENT_AMBULATORY_CARE_PROVIDER_SITE_OTHER): Payer: Medicare Other | Admitting: Physician Assistant

## 2016-11-16 ENCOUNTER — Other Ambulatory Visit: Payer: Self-pay

## 2016-11-16 ENCOUNTER — Encounter: Payer: Self-pay | Admitting: Physician Assistant

## 2016-11-16 VITALS — BP 132/78 | HR 60 | Temp 97.6°F | Resp 16 | Wt 163.6 lb

## 2016-11-16 DIAGNOSIS — L723 Sebaceous cyst: Secondary | ICD-10-CM | POA: Diagnosis not present

## 2016-11-16 MED ORDER — OMEPRAZOLE MAGNESIUM 20 MG PO TBEC: 20.0000 mg | DELAYED_RELEASE_TABLET | Freq: Every day | ORAL | 1 refills | Status: DC

## 2016-11-16 NOTE — Progress Notes (Signed)
Patient ID: AMBRIEL GORELICK MRN: [id number], DOB: [birth year], 66 y.o. Date of Encounter: 11/16/2016, 11:42 AM    Chief Complaint:  Chief Complaint  Patient presents with  . lump on back of neck    has had for several years      HPI: 66 y.o. year old female presents with above.  States that she has had this spot on the back of her neck for many years. Says that she remembers that years ago it would drain and white cottage cheesy material would come out. Says that hasn't happened in a long time. Says that she didn't know if it was one of those things that she should get checked and whether it needed to be removed. States that it is not painful. It isn't causing her any problems but didn't know if she should have it removed/evaluated. No other concerns to address today.     Home Meds:   Outpatient Medications Prior to Visit  Medication Sig Dispense Refill  . atenolol (TENORMIN) 25 MG tablet TAKE 1 TABLET BY MOUTH EVERY DAY 90 tablet 3  . FLUoxetine (PROZAC) 20 MG capsule TAKE 1 CAPSULE BY MOUTH EVERY DAY 90 capsule 3  . GLUCOSAMINE CHONDROITIN COMPLX PO Take 1 tablet by mouth 2 (two) times daily.    Marland Kitchen HYDROcodone-homatropine (HYCODAN) 5-1.5 MG/5ML syrup Take 5 mLs by mouth every 6 (six) hours as needed for cough. 120 mL 0  . losartan (COZAAR) 50 MG tablet TAKE 1 TABLET BY MOUTH EVERY DAY 90 tablet 3  . Multiple Vitamins-Minerals (MULTIVITAMIN,TX-MINERALS) tablet Take 1 tablet by mouth daily.      Marland Kitchen omeprazole (PRILOSEC OTC) 20 MG tablet Take 20 mg by mouth daily.    . pravastatin (PRAVACHOL) 20 MG tablet TAKE 1 TABLET BY MOUTH EVERY DAY 90 tablet 3  . albuterol (PROVENTIL HFA;VENTOLIN HFA) 108 (90 Base) MCG/ACT inhaler Inhale 2 puffs into the lungs every 6 (six) hours as needed for wheezing or shortness of breath. 1 Inhaler 0  . azithromycin (ZITHROMAX) 250 MG tablet Take 2 tablets x 1 day, then 1 tab daily for 4 days 6 tablet 0  . calcium carbonate 200 MG capsule Take 250 mg by  mouth daily. Reported on 09/28/2015    . hydrochlorothiazide (HYDRODIURIL) 25 MG tablet TAKE 1 TABLET BY MOUTH EVERY DAY 90 tablet 3   No facility-administered medications prior to visit.     Allergies: No Known Allergies    Review of Systems: See HPI for pertinent ROS. All other ROS negative.    Physical Exam: Blood pressure 132/78, pulse 60, temperature 97.6 F (36.4 C), temperature source Oral, resp. rate 16, weight 163 lb 9.6 oz (74.2 kg), SpO2 98 %., Body mass index is 31.95 kg/m. General:  WNWD WF. Appears in no acute distress. Neck: Supple. No thyromegaly. No lymphadenopathy. Lungs: Clear bilaterally to auscultation without wheezes, rales, or rhonchi. Breathing is unlabored. Heart: Regular rhythm. No murmurs, rubs, or gallops. Msk:  Strength and tone normal for age. Skin: Posterior neck there is an approximate 1 cm diameter cyst. There is no erythema. There is no tenderness with palpation. Neuro: Alert and oriented X 3. Moves all extremities spontaneously. Gait is normal. CNII-XII grossly in tact. Psych:  Responds to questions appropriately with a normal affect.     ASSESSMENT AND PLAN:  66 y.o. year old female with  1. Sebaceous cyst Explained to patient that this is consistent with a cyst which is benign. Explained that the only reason to  resect it would be if for cosmetic purposes or if it was infected etc. Discussed that if she did want it removed would have her see dermatology for this. Discussed that from a medical standpoint does not have to have it removed. She is not interested in having it resected at this time.   [address] [place], [place], [facility] 11/16/2016 11:42 AM

## 2016-11-20 ENCOUNTER — Other Ambulatory Visit: Payer: Self-pay

## 2016-11-20 MED ORDER — OMEPRAZOLE MAGNESIUM 20 MG PO TBEC: 20.0000 mg | DELAYED_RELEASE_TABLET | Freq: Every day | ORAL | 1 refills | Status: DC

## 2016-11-20 NOTE — Telephone Encounter (Signed)
Rx was sent to the wrong pharmacy. Rx sent to [facility]

## 2016-11-28 ENCOUNTER — Other Ambulatory Visit: Payer: Self-pay | Admitting: Family Medicine

## 2016-11-28 DIAGNOSIS — J209 Acute bronchitis, unspecified: Secondary | ICD-10-CM

## 2017-01-15 DIAGNOSIS — Z1231 Encounter for screening mammogram for malignant neoplasm of breast: Secondary | ICD-10-CM | POA: Diagnosis not present

## 2017-01-15 LAB — HM MAMMOGRAPHY

## 2017-01-16 ENCOUNTER — Encounter: Payer: Self-pay | Admitting: Family Medicine

## 2017-01-29 DIAGNOSIS — Z23 Encounter for immunization: Secondary | ICD-10-CM | POA: Diagnosis not present

## 2017-02-09 ENCOUNTER — Encounter: Payer: Self-pay | Admitting: Gastroenterology

## 2017-03-22 ENCOUNTER — Other Ambulatory Visit: Payer: Self-pay | Admitting: Family Medicine

## 2017-03-28 ENCOUNTER — Other Ambulatory Visit: Payer: Self-pay | Admitting: Family Medicine

## 2017-03-28 NOTE — Telephone Encounter (Signed)
Medication refill for one time only.  Patient needs to be seen.  Letter sent for patient to call and schedule.  Has not had routine OV or fasting lab work since May 2017.

## 2017-04-12 ENCOUNTER — Other Ambulatory Visit: Payer: Self-pay

## 2017-04-30 ENCOUNTER — Other Ambulatory Visit: Payer: Self-pay | Admitting: Family Medicine

## 2017-05-10 ENCOUNTER — Ambulatory Visit: Payer: Medicare Other | Admitting: Family Medicine

## 2017-05-14 ENCOUNTER — Ambulatory Visit: Payer: Medicare Other | Admitting: Family Medicine

## 2017-05-16 ENCOUNTER — Other Ambulatory Visit: Payer: Self-pay | Admitting: Physician Assistant

## 2017-05-16 ENCOUNTER — Other Ambulatory Visit: Payer: Medicare Other

## 2017-05-16 DIAGNOSIS — R7302 Impaired glucose tolerance (oral): Secondary | ICD-10-CM

## 2017-05-16 DIAGNOSIS — E785 Hyperlipidemia, unspecified: Secondary | ICD-10-CM

## 2017-05-16 DIAGNOSIS — I10 Essential (primary) hypertension: Secondary | ICD-10-CM

## 2017-05-16 NOTE — Telephone Encounter (Signed)
Refill appropriate 

## 2017-05-17 LAB — HEMOGLOBIN A1C
EAG (MMOL/L): 7.6 (calc)
HEMOGLOBIN A1C: 6.4 %{Hb} — AB (ref <5.7)
MEAN PLASMA GLUCOSE: 137 (calc)

## 2017-05-17 LAB — COMPREHENSIVE METABOLIC PANEL
AG Ratio: 1.5 (calc) (ref 1.0–2.5)
ALT: 32 U/L — ABNORMAL HIGH (ref 6–29)
AST: 24 U/L (ref 10–35)
Albumin: 4.1 g/dL (ref 3.6–5.1)
Alkaline phosphatase (APISO): 40 U/L (ref 33–130)
BUN: 20 mg/dL (ref 7–25)
CHLORIDE: 105 mmol/L (ref 98–110)
CO2: 25 mmol/L (ref 20–32)
CREATININE: 0.77 mg/dL (ref 0.50–0.99)
Calcium: 9.9 mg/dL (ref 8.6–10.4)
GLOBULIN: 2.8 g/dL (ref 1.9–3.7)
GLUCOSE: 129 mg/dL — AB (ref 65–99)
Potassium: 4 mmol/L (ref 3.5–5.3)
Sodium: 140 mmol/L (ref 135–146)
Total Bilirubin: 0.5 mg/dL (ref 0.2–1.2)
Total Protein: 6.9 g/dL (ref 6.1–8.1)

## 2017-05-17 LAB — LIPID PANEL
Cholesterol: 188 mg/dL (ref <200)
HDL: 49 mg/dL — AB (ref >50)
LDL Cholesterol (Calc): 114 mg/dL (calc) — ABNORMAL HIGH
Non-HDL Cholesterol (Calc): 139 mg/dL (calc) — ABNORMAL HIGH (ref <130)
TRIGLYCERIDES: 140 mg/dL (ref <150)
Total CHOL/HDL Ratio: 3.8 (calc) (ref <5.0)

## 2017-05-17 LAB — CBC WITH DIFFERENTIAL/PLATELET
BASOS PCT: 0.9 %
Basophils Absolute: 49 cells/uL (ref 0–200)
Eosinophils Absolute: 232 cells/uL (ref 15–500)
Eosinophils Relative: 4.3 %
HCT: 37.3 % (ref 35.0–45.0)
Hemoglobin: 12.4 g/dL (ref 11.7–15.5)
Lymphs Abs: 1814 cells/uL (ref 850–3900)
MCH: 30 pg (ref 27.0–33.0)
MCHC: 33.2 g/dL (ref 32.0–36.0)
MCV: 90.3 fL (ref 80.0–100.0)
MONOS PCT: 10.6 %
MPV: 8.9 fL (ref 7.5–12.5)
Neutro Abs: 2732 cells/uL (ref 1500–7800)
Neutrophils Relative %: 50.6 %
PLATELETS: 212 10*3/uL (ref 140–400)
RBC: 4.13 10*6/uL (ref 3.80–5.10)
RDW: 12.8 % (ref 11.0–15.0)
TOTAL LYMPHOCYTE: 33.6 %
WBC mixed population: 572 cells/uL (ref 200–950)
WBC: 5.4 10*3/uL (ref 3.8–10.8)

## 2017-05-21 ENCOUNTER — Ambulatory Visit (INDEPENDENT_AMBULATORY_CARE_PROVIDER_SITE_OTHER): Payer: Medicare Other | Admitting: Family Medicine

## 2017-05-21 ENCOUNTER — Encounter: Payer: Self-pay | Admitting: Family Medicine

## 2017-05-21 VITALS — BP 140/70 | HR 58 | Temp 98.3°F | Resp 16 | Ht 60.0 in | Wt 170.0 lb

## 2017-05-21 DIAGNOSIS — I10 Essential (primary) hypertension: Secondary | ICD-10-CM

## 2017-05-21 DIAGNOSIS — R7303 Prediabetes: Secondary | ICD-10-CM | POA: Diagnosis not present

## 2017-05-21 DIAGNOSIS — E7800 Pure hypercholesterolemia, unspecified: Secondary | ICD-10-CM

## 2017-05-21 MED ORDER — PRAVASTATIN SODIUM 40 MG PO TABS: 40.0000 mg | ORAL_TABLET | Freq: Every day | ORAL | 3 refills | Status: DC

## 2017-05-21 NOTE — Progress Notes (Signed)
Subjective:    Patient ID: Katie Lutz, female    DOB: [birth year], 67 y.o.   MRN: [id number]  HPI Patient is here today for follow-up of her prediabetes. She continues to do carb rich diet. However she is exercising 30 minutes a day 5 days a week. She's been unsuccessful in losing weight. Her blood pressure remains borderline elevated. She denies any chest pain shortness of breath or dyspnea on exertion. She denies any myalgias or right upper quadrant pain. She denies any polyuria, polydipsia, or blurry vision. Her immunizations are up-to-date. She denies any neuropathy in her feet Lab on 05/16/2017  Component Date Value Ref Range Status  . WBC 05/16/2017 5.4  3.8 - 10.8 Thousand/uL Final  . RBC 05/16/2017 4.13  3.80 - 5.10 Million/uL Final  . Hemoglobin 05/16/2017 12.4  11.7 - 15.5 g/dL Final  . HCT 05/16/2017 37.3  35.0 - 45.0 % Final  . MCV 05/16/2017 90.3  80.0 - 100.0 fL Final  . MCH 05/16/2017 30.0  27.0 - 33.0 pg Final  . MCHC 05/16/2017 33.2  32.0 - 36.0 g/dL Final  . RDW 05/16/2017 12.8  11.0 - 15.0 % Final  . Platelets 05/16/2017 212  140 - 400 Thousand/uL Final  . MPV 05/16/2017 8.9  7.5 - 12.5 fL Final  . Neutro Abs 05/16/2017 2,732  1,500 - 7,800 cells/uL Final  . Lymphs Abs 05/16/2017 1,814  850 - 3,900 cells/uL Final  . WBC mixed population 05/16/2017 572  200 - 950 cells/uL Final  . Eosinophils Absolute 05/16/2017 232  15 - 500 cells/uL Final  . Basophils Absolute 05/16/2017 49  0 - 200 cells/uL Final  . Neutrophils Relative % 05/16/2017 50.6  % Final  . Total Lymphocyte 05/16/2017 33.6  % Final  . Monocytes Relative 05/16/2017 10.6  % Final  . Eosinophils Relative 05/16/2017 4.3  % Final  . Basophils Relative 05/16/2017 0.9  % Final  . Glucose, Bld 05/16/2017 129* 65 - 99 mg/dL Final   Comment: .            Fasting reference interval . For someone without known diabetes, a glucose value >125 mg/dL indicates that they may have diabetes and this should be  confirmed with a follow-up test. .   . BUN 05/16/2017 20  7 - 25 mg/dL Final  . Creat 05/16/2017 0.77  0.50 - 0.99 mg/dL Final   Comment: For patients >2 years of age, the reference limit for Creatinine is approximately 13% higher for people identified as African-American. .   Havery Moros Ratio 01/75/1025 NOT APPLICABLE  6 - 22 (calc) Final  . Sodium 05/16/2017 140  135 - 146 mmol/L Final  . Potassium 05/16/2017 4.0  3.5 - 5.3 mmol/L Final  . Chloride 05/16/2017 105  98 - 110 mmol/L Final  . CO2 05/16/2017 25  20 - 32 mmol/L Final  . Calcium 05/16/2017 9.9  8.6 - 10.4 mg/dL Final  . Total Protein 05/16/2017 6.9  6.1 - 8.1 g/dL Final  . Albumin 05/16/2017 4.1  3.6 - 5.1 g/dL Final  . Globulin 05/16/2017 2.8  1.9 - 3.7 g/dL (calc) Final  . AG Ratio 05/16/2017 1.5  1.0 - 2.5 (calc) Final  . Total Bilirubin 05/16/2017 0.5  0.2 - 1.2 mg/dL Final  . Alkaline phosphatase (APISO) 05/16/2017 40  33 - 130 U/L Final  . AST 05/16/2017 24  10 - 35 U/L Final  . ALT 05/16/2017 32* 6 - 29 U/L Final  . Hgb  A1c MFr Bld 05/16/2017 6.4* <5.7 % of total Hgb Final   Comment: For someone without known diabetes, a hemoglobin  A1c value between 5.7% and 6.4% is consistent with prediabetes and should be confirmed with a  follow-up test. . For someone with known diabetes, a value <7% indicates that their diabetes is well controlled. A1c targets should be individualized based on duration of diabetes, age, comorbid conditions, and other considerations. . This assay result is consistent with an increased risk of diabetes. . Currently, no consensus exists regarding use of hemoglobin A1c for diagnosis of diabetes for children. .   . Mean Plasma Glucose 05/16/2017 137  (calc) Final  . eAG (mmol/L) 05/16/2017 7.6  (calc) Final  . Cholesterol 05/16/2017 188  <200 mg/dL Final  . HDL 05/16/2017 49* >50 mg/dL Final  . Triglycerides 05/16/2017 140  <150 mg/dL Final  . LDL Cholesterol (Calc)  05/16/2017 114* mg/dL (calc) Final   Comment: Reference range: <100 . Desirable range <100 mg/dL for primary prevention;   <70 mg/dL for patients with CHD or diabetic patients  with > or = 2 CHD risk factors. Marland Kitchen LDL-C is now calculated using the Martin-Hopkins  calculation, which is a validated novel method providing  better accuracy than the Friedewald equation in the  estimation of LDL-C.  Cresenciano Genre et Katie. Annamaria Helling. 1914;782(95): 2061-2068  (http://education.[facility].com/faq/FAQ164)   . Total CHOL/HDL Ratio 05/16/2017 3.8  <5.0 (calc) Final  . Non-HDL Cholesterol (Calc) 05/16/2017 139* <130 mg/dL (calc) Final   Comment: For patients with diabetes plus 1 major ASCVD risk  factor, treating to a non-HDL-C goal of <100 mg/dL  (LDL-C of <70 mg/dL) is considered a therapeutic  option.    Past Medical History:  Diagnosis Date  . Diabetes mellitus type II, controlled ([place])   . Hyperlipidemia   . Hypertension    Past Surgical History:  Procedure Laterality Date  . TUBAL LIGATION     Current Outpatient Medications on File Prior to Visit  Medication Sig Dispense Refill  . atenolol (TENORMIN) 25 MG tablet TAKE 1 TABLET BY MOUTH EVERY DAY 90 tablet 2  . FLUoxetine (PROZAC) 20 MG capsule TAKE 1 CAPSULE BY MOUTH EVERY DAY 30 capsule 0  . GLUCOSAMINE CHONDROITIN COMPLX PO Take 1 tablet by mouth 2 (two) times daily.    . hydrochlorothiazide (HYDRODIURIL) 25 MG tablet TAKE 1 TABLET BY MOUTH EVERY DAY 30 tablet 0  . losartan (COZAAR) 50 MG tablet TAKE 1 TABLET BY MOUTH EVERY DAY 30 tablet 0  . Multiple Vitamins-Minerals (MULTIVITAMIN,TX-MINERALS) tablet Take 1 tablet by mouth daily.      Marland Kitchen omeprazole (PRILOSEC) 20 MG capsule TAKE 1 CAPSULE BY MOUTH EVERY DAY 90 capsule 1  . pravastatin (PRAVACHOL) 20 MG tablet TAKE 1 TABLET BY MOUTH EVERY DAY 30 tablet 0  . VENTOLIN HFA 108 (90 Base) MCG/ACT inhaler INHALE 2 PUFFS INTO THE LUNGS EVERY 6 HOURS AS NEEDED FOR WHEEZING OR SHORTNESS OF BREATH.  18 Inhaler 0   No current facility-administered medications on file prior to visit.    No Known Allergies Social History   Socioeconomic History  . Marital status: Married    Spouse name: Not on file  . Number of children: Not on file  . Years of education: Not on file  . Highest education level: Not on file  Social Needs  . Financial resource strain: Not on file  . Food insecurity - worry: Not on file  . Food insecurity - inability: Not on file  .  Transportation needs - medical: Not on file  . Transportation needs - non-medical: Not on file  Occupational History  . Not on file  Tobacco Use  . Smoking status: Never Smoker  . Smokeless tobacco: Never Used  Substance and Sexual Activity  . Alcohol use: Yes    Comment: glass wine once a week  . Drug use: No  . Sexual activity: Not on file  Other Topics Concern  . Not on file  Social History Narrative  . Not on file      Review of Systems  All other systems reviewed and are negative.      Objective:   Physical Exam  Constitutional: She appears well-developed and well-nourished.  Cardiovascular: Normal rate, regular rhythm and normal heart sounds.  Pulmonary/Chest: Effort normal and breath sounds normal. No respiratory distress. She has no wheezes. She has no rales.  Abdominal: Soft. Bowel sounds are normal. She exhibits no distension. There is no tenderness. There is no rebound and no guarding.  Musculoskeletal: She exhibits no edema.  Vitals reviewed.         Assessment & Plan:  Essential hypertension  Pure hypercholesterolemia  Prediabetes  Blood pressure today is borderline. I've encouraged the patient to monitor her blood pressure more closely and notify me if consistently greater than 140/90. Hemoglobin A1c remains stable and significant for borderline diabetes mellitus type 2. Continue to encourage weight loss and aggressive dietary changes. Encouraged the patient to continue exercising. Her flu shot  and pneumonia vaccines are up-to-date and diabetic foot exam is performed today and is normal. LDL cholesterol is greater than goal of 114. I will increase pravastatin to 40 mg a day and recheck cholesterol in 6 months

## 2017-05-29 ENCOUNTER — Ambulatory Visit (INDEPENDENT_AMBULATORY_CARE_PROVIDER_SITE_OTHER): Payer: Medicare Other

## 2017-05-29 DIAGNOSIS — Z23 Encounter for immunization: Secondary | ICD-10-CM

## 2017-05-29 NOTE — Progress Notes (Signed)
Patient was in office for 1st dose of shingrix vaccine. Patient received vaccine in her left deltoid.Patient tolerated well. Patient was instructed to schedule appointment for booster in 2-3 months

## 2017-06-03 ENCOUNTER — Other Ambulatory Visit: Payer: Self-pay | Admitting: Family Medicine

## 2017-08-02 ENCOUNTER — Ambulatory Visit: Payer: Medicare Other | Admitting: *Deleted

## 2017-08-02 DIAGNOSIS — Z23 Encounter for immunization: Secondary | ICD-10-CM

## 2017-09-28 DIAGNOSIS — Z961 Presence of intraocular lens: Secondary | ICD-10-CM | POA: Diagnosis not present

## 2017-09-28 DIAGNOSIS — H31093 Other chorioretinal scars, bilateral: Secondary | ICD-10-CM | POA: Diagnosis not present

## 2017-09-28 DIAGNOSIS — Z9889 Other specified postprocedural states: Secondary | ICD-10-CM | POA: Diagnosis not present

## 2017-09-28 DIAGNOSIS — H2511 Age-related nuclear cataract, right eye: Secondary | ICD-10-CM | POA: Diagnosis not present

## 2017-09-28 DIAGNOSIS — H26492 Other secondary cataract, left eye: Secondary | ICD-10-CM | POA: Diagnosis not present

## 2017-09-28 DIAGNOSIS — H10413 Chronic giant papillary conjunctivitis, bilateral: Secondary | ICD-10-CM | POA: Diagnosis not present

## 2017-11-12 ENCOUNTER — Other Ambulatory Visit: Payer: Self-pay | Admitting: Physician Assistant

## 2017-12-18 ENCOUNTER — Ambulatory Visit (INDEPENDENT_AMBULATORY_CARE_PROVIDER_SITE_OTHER): Payer: Medicare Other | Admitting: Family Medicine

## 2017-12-18 ENCOUNTER — Encounter: Payer: Self-pay | Admitting: Family Medicine

## 2017-12-18 VITALS — BP 162/78 | HR 66 | Temp 98.1°F | Resp 18 | Ht 60.0 in | Wt 168.0 lb

## 2017-12-18 DIAGNOSIS — L723 Sebaceous cyst: Secondary | ICD-10-CM | POA: Diagnosis not present

## 2017-12-18 DIAGNOSIS — Z1159 Encounter for screening for other viral diseases: Secondary | ICD-10-CM

## 2017-12-18 DIAGNOSIS — R7303 Prediabetes: Secondary | ICD-10-CM

## 2017-12-18 DIAGNOSIS — I10 Essential (primary) hypertension: Secondary | ICD-10-CM

## 2017-12-18 DIAGNOSIS — E7800 Pure hypercholesterolemia, unspecified: Secondary | ICD-10-CM

## 2017-12-18 DIAGNOSIS — K30 Functional dyspepsia: Secondary | ICD-10-CM

## 2017-12-18 DIAGNOSIS — E785 Hyperlipidemia, unspecified: Secondary | ICD-10-CM | POA: Diagnosis not present

## 2017-12-18 DIAGNOSIS — L989 Disorder of the skin and subcutaneous tissue, unspecified: Secondary | ICD-10-CM

## 2017-12-18 NOTE — Progress Notes (Signed)
Subjective:    Patient ID: Katie Lutz, female    DOB: 07-17-1950, 67 y.o.   MRN: [id number]  HPI  05/2017 Patient is here today for follow-up of her prediabetes. She continues to do carb rich diet. However she is exercising 30 minutes a day 5 days a week. She's been unsuccessful in losing weight. Her blood pressure remains borderline elevated. She denies any chest pain shortness of breath or dyspnea on exertion. She denies any myalgias or right upper quadrant pain. She denies any polyuria, polydipsia, or blurry vision. Her immunizations are up-to-date. She denies any neuropathy in her feet.  At that time, my plan was: Blood pressure today is borderline. I've encouraged the patient to monitor her blood pressure more closely and notify me if consistently greater than 140/90. Hemoglobin A1c remains stable and significant for borderline diabetes mellitus type 2. Continue to encourage weight loss and aggressive dietary changes. Encouraged the patient to continue exercising. Her flu shot and pneumonia vaccines are up-to-date and diabetic foot exam is performed today and is normal. LDL cholesterol is greater than goal of 114. I will increase pravastatin to 40 mg a day and recheck cholesterol in 6 months  12/18/17 Patient presents today with multiple complaints.  #1 she reports early satiety, bloating, and delayed gastric emptying for several months.  She states that soon as she eats something, she will feel extremely full.  If feels like the food just sits in her stomach for several hours.  Frequently she will regurgitate undigested food such as lettuce, etc. 30 minutes after she has eaten.  It takes a long time for her stomach to feel like he is emptying.  She constantly feels bloated.  She denies any melena.  She denies any hematochezia.  She denies any hematemesis.  She denies any weight loss or fever.  She denies any abdominal pain.  #2 her blood pressure today is extremely high.  She is not checking  her blood pressure at home.  She is taking her medication.  #3 she has a lesion on the tip of her nose that continues to persist.  It will flake off, occasionally bleed, and come back.  Is approximately 4 mm.  It is an erythematous scaly papule concerning for possible skin cancer.  #4 she has a sebaceous cyst on the posterior aspect of her neck.  It is roughly 1.5 cm in diameter with a central pore. Past Medical History:  Diagnosis Date  . Diabetes mellitus type II, controlled ([place])   . Hyperlipidemia   . Hypertension    Past Surgical History:  Procedure Laterality Date  . TUBAL LIGATION     Current Outpatient Medications on File Prior to Visit  Medication Sig Dispense Refill  . atenolol (TENORMIN) 25 MG tablet TAKE 1 TABLET BY MOUTH EVERY DAY 90 tablet 2  . FLUoxetine (PROZAC) 20 MG capsule TAKE 1 CAPSULE BY MOUTH EVERY DAY 90 capsule 3  . GLUCOSAMINE CHONDROITIN COMPLX PO Take 1 tablet by mouth 2 (two) times daily.    . hydrochlorothiazide (HYDRODIURIL) 25 MG tablet TAKE 1 TABLET BY MOUTH EVERY DAY 90 tablet 3  . losartan (COZAAR) 50 MG tablet TAKE 1 TABLET BY MOUTH EVERY DAY 90 tablet 3  . Multiple Vitamins-Minerals (MULTIVITAMIN,TX-MINERALS) tablet Take 1 tablet by mouth daily.      Marland Kitchen omeprazole (PRILOSEC) 20 MG capsule TAKE 1 CAPSULE BY MOUTH EVERY DAY 90 capsule 1  . pravastatin (PRAVACHOL) 20 MG tablet TAKE 1 TABLET BY MOUTH EVERY  DAY 30 tablet 0  . pravastatin (PRAVACHOL) 40 MG tablet Take 1 tablet (40 mg total) by mouth daily. 90 tablet 3  . VENTOLIN HFA 108 (90 Base) MCG/ACT inhaler INHALE 2 PUFFS INTO THE LUNGS EVERY 6 HOURS AS NEEDED FOR WHEEZING OR SHORTNESS OF BREATH. 18 Inhaler 0   No current facility-administered medications on file prior to visit.    No Known Allergies Social History   Socioeconomic History  . Marital status: Married    Spouse name: Not on file  . Number of children: Not on file  . Years of education: Not on file  . Highest education level: Not on  file  Occupational History  . Not on file  Social Needs  . Financial resource strain: Not on file  . Food insecurity:    Worry: Not on file    Inability: Not on file  . Transportation needs:    Medical: Not on file    Non-medical: Not on file  Tobacco Use  . Smoking status: Never Smoker  . Smokeless tobacco: Never Used  Substance and Sexual Activity  . Alcohol use: Yes    Comment: glass wine once a week  . Drug use: No  . Sexual activity: Not on file  Lifestyle  . Physical activity:    Days per week: Not on file    Minutes per session: Not on file  . Stress: Not on file  Relationships  . Social connections:    Talks on phone: Not on file    Gets together: Not on file    Attends religious service: Not on file    Active member of club or organization: Not on file    Attends meetings of clubs or organizations: Not on file    Relationship status: Not on file  . Intimate partner violence:    Fear of current or ex partner: Not on file    Emotionally abused: Not on file    Physically abused: Not on file    Forced sexual activity: Not on file  Other Topics Concern  . Not on file  Social History Narrative  . Not on file      Review of Systems  All other systems reviewed and are negative.      Objective:   Physical Exam  Constitutional: She appears well-developed and well-nourished.  Cardiovascular: Normal rate, regular rhythm and normal heart sounds.  Pulmonary/Chest: Effort normal and breath sounds normal. No respiratory distress. She has no wheezes. She has no rales.  Abdominal: Soft. Bowel sounds are normal. She exhibits no distension. There is no tenderness. There is no rebound and no guarding.  Musculoskeletal: She exhibits no edema.  Vitals reviewed.   Skin lesion on the nose as described in the history of present illness.  Sebaceous cyst on the posterior neck as described in the history of present illness      Assessment & Plan:  Essential  hypertension  Pure hypercholesterolemia  Prediabetes - Plan: CBC with Differential/Platelet, COMPLETE METABOLIC PANEL WITH GFR, Lipid panel, Microalbumin, urine, Hemoglobin A1c  Encounter for hepatitis C screening test for low risk patient - Plan: Hepatitis C Antibody  Non-healing skin lesion of nose - Plan: Ambulatory referral to Dermatology  Sebaceous cyst - Plan: Ambulatory referral to Dermatology  Delayed gastric emptying - Plan: Ambulatory referral to Gastroenterology  While the patient is here, I will check her fasting labs to monitor her prediabetes including a hemoglobin A1c, CMP, fasting lipid panel.  I am concerned about  her blood pressure.  She will check it 3 times a day and notify me of the values in 1 week.  If greater than 140/90, we will need to uptitrate her medication to achieve better blood pressure control.  I will also recheck the patient's cholesterol as she has had an elevated LDL cholesterol in the past and given her prediabetes, her goal LDL cholesterol is less than 100.  While checking lab work, I will screen the patient for hepatitis C.  Her history is concerning for delayed gastric emptying.  Given her prediabetes, gastroparesis is a possibility, IBS is a possibility, however I will consult GI for an EGD to rule out gastric outlet obstruction.  I will consult dermatology to remove the sebaceous cyst as well as to biopsy the nonhealing lesion on the tip of her nose.

## 2017-12-19 LAB — LIPID PANEL
CHOL/HDL RATIO: 3.5 (calc) (ref <5.0)
CHOLESTEROL: 173 mg/dL (ref <200)
HDL: 49 mg/dL — ABNORMAL LOW (ref >50)
LDL CHOLESTEROL (CALC): 97 mg/dL
Non-HDL Cholesterol (Calc): 124 mg/dL (calc) (ref <130)
TRIGLYCERIDES: 171 mg/dL — AB (ref <150)

## 2017-12-19 LAB — COMPLETE METABOLIC PANEL WITH GFR
AG Ratio: 1.3 (calc) (ref 1.0–2.5)
ALKALINE PHOSPHATASE (APISO): 40 U/L (ref 33–130)
ALT: 36 U/L — AB (ref 6–29)
AST: 26 U/L (ref 10–35)
Albumin: 4 g/dL (ref 3.6–5.1)
BILIRUBIN TOTAL: 0.4 mg/dL (ref 0.2–1.2)
BUN: 20 mg/dL (ref 7–25)
CHLORIDE: 105 mmol/L (ref 98–110)
CO2: 21 mmol/L (ref 20–32)
CREATININE: 0.78 mg/dL (ref 0.50–0.99)
Calcium: 10.2 mg/dL (ref 8.6–10.4)
GFR, Est African American: 90 mL/min/{1.73_m2} (ref >=60)
GFR, Est Non African American: 78 mL/min/{1.73_m2} (ref >=60)
GLUCOSE: 133 mg/dL — AB (ref 65–99)
Globulin: 3 g/dL (calc) (ref 1.9–3.7)
Potassium: 4 mmol/L (ref 3.5–5.3)
Sodium: 140 mmol/L (ref 135–146)
TOTAL PROTEIN: 7 g/dL (ref 6.1–8.1)

## 2017-12-19 LAB — CBC WITH DIFFERENTIAL/PLATELET
BASOS PCT: 0.6 %
Basophils Absolute: 31 cells/uL (ref 0–200)
EOS ABS: 189 {cells}/uL (ref 15–500)
Eosinophils Relative: 3.7 %
HCT: 38.1 % (ref 35.0–45.0)
Hemoglobin: 12.5 g/dL (ref 11.7–15.5)
Lymphs Abs: 1729 cells/uL (ref 850–3900)
MCH: 29.8 pg (ref 27.0–33.0)
MCHC: 32.8 g/dL (ref 32.0–36.0)
MCV: 90.9 fL (ref 80.0–100.0)
MPV: 8.9 fL (ref 7.5–12.5)
Monocytes Relative: 10.2 %
NEUTROS PCT: 51.6 %
Neutro Abs: 2632 cells/uL (ref 1500–7800)
PLATELETS: 233 10*3/uL (ref 140–400)
RBC: 4.19 10*6/uL (ref 3.80–5.10)
RDW: 12.8 % (ref 11.0–15.0)
TOTAL LYMPHOCYTE: 33.9 %
WBC: 5.1 10*3/uL (ref 3.8–10.8)
WBCMIX: 520 {cells}/uL (ref 200–950)

## 2017-12-19 LAB — HEMOGLOBIN A1C
EAG (MMOL/L): 7.7 (calc)
Hgb A1c MFr Bld: 6.5 % of total Hgb — ABNORMAL HIGH (ref <5.7)
Mean Plasma Glucose: 140 (calc)

## 2017-12-19 LAB — HEPATITIS C ANTIBODY
HEP C AB: NONREACTIVE
SIGNAL TO CUT-OFF: 0.04 (ref <1.00)

## 2017-12-19 LAB — MICROALBUMIN, URINE: Microalb, Ur: 1.4 mg/dL

## 2017-12-24 ENCOUNTER — Encounter: Payer: Self-pay | Admitting: Gastroenterology

## 2018-01-29 ENCOUNTER — Other Ambulatory Visit: Payer: Self-pay | Admitting: Family Medicine

## 2018-01-29 DIAGNOSIS — J209 Acute bronchitis, unspecified: Secondary | ICD-10-CM

## 2018-02-06 DIAGNOSIS — L720 Epidermal cyst: Secondary | ICD-10-CM | POA: Diagnosis not present

## 2018-02-06 DIAGNOSIS — D2239 Melanocytic nevi of other parts of face: Secondary | ICD-10-CM | POA: Diagnosis not present

## 2018-02-12 ENCOUNTER — Ambulatory Visit: Payer: No Typology Code available for payment source | Admitting: Gastroenterology

## 2018-02-15 DIAGNOSIS — Z1231 Encounter for screening mammogram for malignant neoplasm of breast: Secondary | ICD-10-CM | POA: Diagnosis not present

## 2018-02-15 LAB — HM MAMMOGRAPHY

## 2018-02-18 ENCOUNTER — Ambulatory Visit (INDEPENDENT_AMBULATORY_CARE_PROVIDER_SITE_OTHER): Payer: Medicare Other | Admitting: *Deleted

## 2018-02-18 DIAGNOSIS — Z23 Encounter for immunization: Secondary | ICD-10-CM

## 2018-02-20 ENCOUNTER — Encounter: Payer: Self-pay | Admitting: *Deleted

## 2018-02-20 DIAGNOSIS — L720 Epidermal cyst: Secondary | ICD-10-CM | POA: Diagnosis not present

## 2018-03-11 ENCOUNTER — Other Ambulatory Visit: Payer: Self-pay | Admitting: Family Medicine

## 2018-03-11 ENCOUNTER — Telehealth: Payer: Self-pay | Admitting: Family Medicine

## 2018-03-11 NOTE — Telephone Encounter (Signed)
Pt called [facility] stating that she would like to do the Cologuard, could we please send that in so she can get that done instead of a Colonoscopy. Paper work faxed to [employer].

## 2018-03-13 NOTE — Telephone Encounter (Signed)
Followed up with [facility] and orders were received.

## 2018-04-11 DIAGNOSIS — Z1211 Encounter for screening for malignant neoplasm of colon: Secondary | ICD-10-CM | POA: Diagnosis not present

## 2018-04-11 DIAGNOSIS — Z1212 Encounter for screening for malignant neoplasm of rectum: Secondary | ICD-10-CM | POA: Diagnosis not present

## 2018-04-12 LAB — COLOGUARD

## 2018-04-29 ENCOUNTER — Other Ambulatory Visit: Payer: Self-pay | Admitting: *Deleted

## 2018-04-29 MED ORDER — OMEPRAZOLE 20 MG PO CPDR: DELAYED_RELEASE_CAPSULE | ORAL | 3 refills | Status: DC

## 2018-05-03 ENCOUNTER — Encounter: Payer: Self-pay | Admitting: Family Medicine

## 2018-05-09 NOTE — Telephone Encounter (Signed)
Received the results of Cologuard screening.   Screening is negative.   A negative result indicates a low likelihood of colorectal cancer is present. Following a negative Cologuard result, the American Cancer Society recommends a Cologuard re-screening interval of 3 years.   Letter sent.  

## 2018-05-30 ENCOUNTER — Other Ambulatory Visit: Payer: Self-pay | Admitting: Family Medicine

## 2018-06-05 ENCOUNTER — Other Ambulatory Visit: Payer: Self-pay | Admitting: Family Medicine

## 2018-06-05 MED ORDER — LOSARTAN POTASSIUM 100 MG PO TABS: 50.0000 mg | ORAL_TABLET | Freq: Every day | ORAL | 3 refills | Status: DC

## 2018-06-06 ENCOUNTER — Other Ambulatory Visit: Payer: Self-pay | Admitting: *Deleted

## 2018-06-06 MED ORDER — LOSARTAN POTASSIUM 100 MG PO TABS: 50.0000 mg | ORAL_TABLET | Freq: Every day | ORAL | 3 refills | Status: DC

## 2018-08-02 ENCOUNTER — Other Ambulatory Visit: Payer: Self-pay | Admitting: Family Medicine

## 2018-10-01 DIAGNOSIS — H2511 Age-related nuclear cataract, right eye: Secondary | ICD-10-CM | POA: Diagnosis not present

## 2018-10-01 DIAGNOSIS — Z9889 Other specified postprocedural states: Secondary | ICD-10-CM | POA: Diagnosis not present

## 2018-10-01 DIAGNOSIS — Z961 Presence of intraocular lens: Secondary | ICD-10-CM | POA: Diagnosis not present

## 2018-10-01 DIAGNOSIS — H10413 Chronic giant papillary conjunctivitis, bilateral: Secondary | ICD-10-CM | POA: Diagnosis not present

## 2018-10-01 DIAGNOSIS — H26492 Other secondary cataract, left eye: Secondary | ICD-10-CM | POA: Diagnosis not present

## 2018-10-01 DIAGNOSIS — H31093 Other chorioretinal scars, bilateral: Secondary | ICD-10-CM | POA: Diagnosis not present

## 2018-11-11 ENCOUNTER — Encounter: Payer: Self-pay | Admitting: Family Medicine

## 2018-11-11 ENCOUNTER — Ambulatory Visit (INDEPENDENT_AMBULATORY_CARE_PROVIDER_SITE_OTHER): Payer: Medicare Other | Admitting: Family Medicine

## 2018-11-11 ENCOUNTER — Other Ambulatory Visit: Payer: Self-pay

## 2018-11-11 VITALS — BP 134/76 | HR 58 | Temp 98.9°F | Resp 16 | Ht 60.0 in | Wt 169.0 lb

## 2018-11-11 DIAGNOSIS — K3184 Gastroparesis: Secondary | ICD-10-CM | POA: Diagnosis not present

## 2018-11-11 DIAGNOSIS — R140 Abdominal distension (gaseous): Secondary | ICD-10-CM | POA: Diagnosis not present

## 2018-11-11 MED ORDER — METOCLOPRAMIDE HCL 10 MG PO TABS: 10.0000 mg | ORAL_TABLET | Freq: Three times a day (TID) | ORAL | 0 refills | Status: DC

## 2018-11-11 NOTE — Progress Notes (Signed)
Subjective:    Patient ID: Katie Lutz, female    DOB: 1950-05-25, 68 y.o.   MRN: [id number]  HPI  Patient presents with gradually worsening early satiety and bloating.  She states that she feels extremely full and bloated and nauseated even 1-1/2 hours after eating no matter what type of food she eats.  She states that she feels pregnant.  Her abdomen always feels full and distended.  She denies any dysphasia.  She denies any odynophagia.  She does have acid reflux.  She will occasionally burp even an hour after eating and regurgitate undigested food.  However she denies any constipation.  She has 1 or 2 bowel movements every day that are soft and normal in consistency with no melena or hematochezia.  She denies any bilious vomiting.  She denies any abdominal pain although she does report constant bloating and distention.  Past medical history significant for borderline diabetes mellitus however she has no history of gastroparesis.  She denies any right upper quadrant pain or biliary colic.  She denies any alcohol use or history of pancreatic issues.  No specific food seems to trigger this.  She denies any weight loss fevers or chills or systemic symptoms Past Medical History:  Diagnosis Date  . Diabetes mellitus type II, controlled ([place])   . Hyperlipidemia   . Hypertension    Past Surgical History:  Procedure Laterality Date  . TUBAL LIGATION     Current Outpatient Medications on File Prior to Visit  Medication Sig Dispense Refill  . atenolol (TENORMIN) 25 MG tablet TAKE 1 TABLET BY MOUTH EVERY DAY 90 tablet 2  . FLUoxetine (PROZAC) 20 MG capsule TAKE 1 CAPSULE BY MOUTH EVERY DAY 90 capsule 3  . hydrochlorothiazide (HYDRODIURIL) 25 MG tablet TAKE 1 TABLET BY MOUTH EVERY DAY 90 tablet 3  . losartan (COZAAR) 100 MG tablet Take 0.5 tablets (50 mg total) by mouth daily. 45 tablet 3  . losartan (COZAAR) 50 MG tablet TAKE 1 TABLET BY MOUTH EVERY DAY 90 tablet 3  . Multiple  Vitamins-Minerals (MULTIVITAMIN,TX-MINERALS) tablet Take 1 tablet by mouth daily.      Marland Kitchen omeprazole (PRILOSEC) 20 MG capsule TAKE 1 CAPSULE BY MOUTH EVERY DAY 90 capsule 3  . pravastatin (PRAVACHOL) 40 MG tablet TAKE 1 TABLET BY MOUTH EVERY DAY 90 tablet 3  . VENTOLIN HFA 108 (90 Base) MCG/ACT inhaler INHALE 2 PUFFS INTO THE LUNGS EVERY 6 HOURS AS NEEDED FOR WHEEZING OR SHORTNESS OF BREATH. 18 Inhaler 3   No current facility-administered medications on file prior to visit.    No Known Allergies Social History   Socioeconomic History  . Marital status: Married    Spouse name: Not on file  . Number of children: Not on file  . Years of education: Not on file  . Highest education level: Not on file  Occupational History  . Not on file  Social Needs  . Financial resource strain: Not on file  . Food insecurity    Worry: Not on file    Inability: Not on file  . Transportation needs    Medical: Not on file    Non-medical: Not on file  Tobacco Use  . Smoking status: Never Smoker  . Smokeless tobacco: Never Used  Substance and Sexual Activity  . Alcohol use: Yes    Comment: glass wine once a week  . Drug use: No  . Sexual activity: Not on file  Lifestyle  . Physical activity  Days per week: Not on file    Minutes per session: Not on file  . Stress: Not on file  Relationships  . Social Herbalist on phone: Not on file    Gets together: Not on file    Attends religious service: Not on file    Active member of club or organization: Not on file    Attends meetings of clubs or organizations: Not on file    Relationship status: Not on file  . Intimate partner violence    Fear of current or ex partner: Not on file    Emotionally abused: Not on file    Physically abused: Not on file    Forced sexual activity: Not on file  Other Topics Concern  . Not on file  Social History Narrative  . Not on file      Review of Systems  All other systems reviewed and are  negative.      Objective:   Physical Exam  Constitutional: She appears well-developed and well-nourished.  Cardiovascular: Normal rate, regular rhythm and normal heart sounds.  Pulmonary/Chest: Effort normal and breath sounds normal. No respiratory distress. She has no wheezes. She has no rales.  Abdominal: Soft. Bowel sounds are normal. She exhibits no distension and no mass. There is no abdominal tenderness. There is no rebound and no guarding.  Musculoskeletal:        General: No edema.  Vitals reviewed.      Assessment & Plan:  The encounter diagnosis was Abdominal bloating. Begin by obtaining some baseline lab work including a CBC, CMP, and lipase.  Differential diagnosis includes irritable bowel syndrome, gastroparesis, gastric outlet obstruction/stricture, etc.  I will try the patient empirically on Reglan 10 mg before meals and reassess the patient via telephone in 1 week to see if her symptoms have improved.  Also obtain gastric emptying study to evaluate for gastroparesis or obstruction.  If symptoms do not improve and initial work-up is negative, consider CT scan of the abdomen and pelvis versus EGD

## 2018-11-12 ENCOUNTER — Other Ambulatory Visit: Payer: Self-pay | Admitting: Family Medicine

## 2018-11-12 DIAGNOSIS — R7989 Other specified abnormal findings of blood chemistry: Secondary | ICD-10-CM

## 2018-11-12 LAB — CBC WITH DIFFERENTIAL/PLATELET
Absolute Monocytes: 760 cells/uL (ref 200–950)
Basophils Absolute: 50 cells/uL (ref 0–200)
Basophils Relative: 0.7 %
Eosinophils Absolute: 149 cells/uL (ref 15–500)
Eosinophils Relative: 2.1 %
HCT: 41.6 % (ref 35.0–45.0)
Hemoglobin: 14.3 g/dL (ref 11.7–15.5)
Lymphs Abs: 2158 cells/uL (ref 850–3900)
MCH: 31.2 pg (ref 27.0–33.0)
MCHC: 34.4 g/dL (ref 32.0–36.0)
MCV: 90.8 fL (ref 80.0–100.0)
MPV: 9.3 fL (ref 7.5–12.5)
Monocytes Relative: 10.7 %
Neutro Abs: 3983 cells/uL (ref 1500–7800)
Neutrophils Relative %: 56.1 %
Platelets: 271 10*3/uL (ref 140–400)
RBC: 4.58 10*6/uL (ref 3.80–5.10)
RDW: 12.7 % (ref 11.0–15.0)
Total Lymphocyte: 30.4 %
WBC: 7.1 10*3/uL (ref 3.8–10.8)

## 2018-11-12 LAB — COMPLETE METABOLIC PANEL WITH GFR
AG Ratio: 1.2 (calc) (ref 1.0–2.5)
ALT: 72 U/L — ABNORMAL HIGH (ref 6–29)
AST: 64 U/L — ABNORMAL HIGH (ref 10–35)
Albumin: 4.4 g/dL (ref 3.6–5.1)
Alkaline phosphatase (APISO): 45 U/L (ref 37–153)
BUN/Creatinine Ratio: 20 (calc) (ref 6–22)
BUN: 20 mg/dL (ref 7–25)
CO2: 25 mmol/L (ref 20–32)
Calcium: 11.2 mg/dL — ABNORMAL HIGH (ref 8.6–10.4)
Chloride: 103 mmol/L (ref 98–110)
Creat: 1.01 mg/dL — ABNORMAL HIGH (ref 0.60–0.93)
GFR, Est African American: 65 mL/min/{1.73_m2} (ref >=60)
GFR, Est Non African American: 56 mL/min/{1.73_m2} — ABNORMAL LOW (ref >=60)
Globulin: 3.6 g/dL (calc) (ref 1.9–3.7)
Glucose, Bld: 110 mg/dL — ABNORMAL HIGH (ref 65–99)
Potassium: 4.7 mmol/L (ref 3.5–5.3)
Sodium: 137 mmol/L (ref 135–146)
Total Bilirubin: 0.4 mg/dL (ref 0.2–1.2)
Total Protein: 8 g/dL (ref 6.1–8.1)

## 2018-11-12 LAB — LIPASE: Lipase: 31 U/L (ref 7–60)

## 2018-11-13 ENCOUNTER — Other Ambulatory Visit: Payer: Medicare Other

## 2018-11-13 ENCOUNTER — Other Ambulatory Visit: Payer: Self-pay

## 2018-11-13 DIAGNOSIS — R7989 Other specified abnormal findings of blood chemistry: Secondary | ICD-10-CM

## 2018-11-14 LAB — PTH, INTACT AND CALCIUM
Calcium: 10.3 mg/dL (ref 8.6–10.4)
PTH: 86 pg/mL — ABNORMAL HIGH (ref 14–64)

## 2018-11-19 ENCOUNTER — Other Ambulatory Visit: Payer: Medicare Other

## 2018-11-19 ENCOUNTER — Other Ambulatory Visit: Payer: Self-pay

## 2018-11-19 ENCOUNTER — Other Ambulatory Visit: Payer: No Typology Code available for payment source

## 2018-11-19 DIAGNOSIS — R945 Abnormal results of liver function studies: Secondary | ICD-10-CM | POA: Diagnosis not present

## 2018-11-19 DIAGNOSIS — R7989 Other specified abnormal findings of blood chemistry: Secondary | ICD-10-CM

## 2018-11-20 LAB — COMPREHENSIVE METABOLIC PANEL
AG Ratio: 1.3 (calc) (ref 1.0–2.5)
ALT: 41 U/L — ABNORMAL HIGH (ref 6–29)
AST: 31 U/L (ref 10–35)
Albumin: 3.8 g/dL (ref 3.6–5.1)
Alkaline phosphatase (APISO): 41 U/L (ref 37–153)
BUN: 14 mg/dL (ref 7–25)
CO2: 25 mmol/L (ref 20–32)
Calcium: 9.8 mg/dL (ref 8.6–10.4)
Chloride: 109 mmol/L (ref 98–110)
Creat: 0.72 mg/dL (ref 0.60–0.93)
Globulin: 2.9 g/dL (calc) (ref 1.9–3.7)
Glucose, Bld: 149 mg/dL — ABNORMAL HIGH (ref 65–99)
Potassium: 3.9 mmol/L (ref 3.5–5.3)
Sodium: 141 mmol/L (ref 135–146)
Total Bilirubin: 0.3 mg/dL (ref 0.2–1.2)
Total Protein: 6.7 g/dL (ref 6.1–8.1)

## 2018-11-20 LAB — EXTRA LAV TOP TUBE

## 2018-11-21 ENCOUNTER — Ambulatory Visit
Admission: RE | Admit: 2018-11-21 | Discharge: 2018-11-21 | Disposition: A | Discharge status: Home / Self Care | Payer: No Typology Code available for payment source | Source: Ambulatory Visit | Attending: Family Medicine | Admitting: Family Medicine

## 2018-11-21 DIAGNOSIS — R7989 Other specified abnormal findings of blood chemistry: Secondary | ICD-10-CM

## 2018-11-21 DIAGNOSIS — K828 Other specified diseases of gallbladder: Secondary | ICD-10-CM | POA: Diagnosis not present

## 2018-11-22 ENCOUNTER — Encounter (HOSPITAL_COMMUNITY)
Admission: RE | Admit: 2018-11-22 | Discharge: 2018-11-22 | Disposition: A | Discharge status: Home / Self Care | Payer: Medicare Other | Source: Ambulatory Visit | Attending: Family Medicine | Admitting: Family Medicine

## 2018-11-22 ENCOUNTER — Other Ambulatory Visit: Payer: Self-pay

## 2018-11-22 DIAGNOSIS — R140 Abdominal distension (gaseous): Secondary | ICD-10-CM | POA: Diagnosis not present

## 2018-11-22 DIAGNOSIS — K3184 Gastroparesis: Secondary | ICD-10-CM | POA: Diagnosis not present

## 2018-11-22 DIAGNOSIS — R109 Unspecified abdominal pain: Secondary | ICD-10-CM | POA: Diagnosis not present

## 2018-11-22 MED ORDER — TECHNETIUM TC 99M SULFUR COLLOID
2.0000 | Freq: Once | INTRAVENOUS | Status: AC | PRN
  Administered 2018-11-22: 2 via INTRAVENOUS

## 2018-11-25 ENCOUNTER — Other Ambulatory Visit: Payer: Self-pay

## 2018-11-25 ENCOUNTER — Ambulatory Visit (INDEPENDENT_AMBULATORY_CARE_PROVIDER_SITE_OTHER): Payer: Medicare Other | Admitting: Family Medicine

## 2018-11-25 ENCOUNTER — Encounter: Payer: Self-pay | Admitting: Family Medicine

## 2018-11-25 VITALS — BP 170/92 | HR 54 | Temp 98.6°F | Resp 16 | Ht 60.0 in | Wt 169.0 lb

## 2018-11-25 DIAGNOSIS — I10 Essential (primary) hypertension: Secondary | ICD-10-CM

## 2018-11-25 DIAGNOSIS — E7800 Pure hypercholesterolemia, unspecified: Secondary | ICD-10-CM | POA: Diagnosis not present

## 2018-11-25 DIAGNOSIS — R945 Abnormal results of liver function studies: Secondary | ICD-10-CM

## 2018-11-25 DIAGNOSIS — R7989 Other specified abnormal findings of blood chemistry: Secondary | ICD-10-CM

## 2018-11-25 MED ORDER — ROSUVASTATIN CALCIUM 5 MG PO TABS: 5.0000 mg | ORAL_TABLET | Freq: Every day | ORAL | 3 refills | Status: AC

## 2018-11-25 MED ORDER — DOXAZOSIN MESYLATE 2 MG PO TABS: 2.0000 mg | ORAL_TABLET | Freq: Every day | ORAL | 2 refills | Status: DC

## 2018-11-25 NOTE — Progress Notes (Signed)
Subjective:    Patient ID: Katie Lutz, female    DOB: 09/10/1950, 68 y.o.   MRN: [id number]  HPI  11/11/18 Patient presents with gradually worsening early satiety and bloating.  She states that she feels extremely full and bloated and nauseated even 1-1/2 hours after eating no matter what type of food she eats.  She states that she feels pregnant.  Her abdomen always feels full and distended.  She denies any dysphasia.  She denies any odynophagia.  She does have acid reflux.  She will occasionally burp even an hour after eating and regurgitate undigested food.  However she denies any constipation.  She has 1 or 2 bowel movements every day that are soft and normal in consistency with no melena or hematochezia.  She denies any bilious vomiting.  She denies any abdominal pain although she does report constant bloating and distention.  Past medical history significant for borderline diabetes mellitus however she has no history of gastroparesis.  She denies any right upper quadrant pain or biliary colic.  She denies any alcohol use or history of pancreatic issues.  No specific food seems to trigger this.  She denies any weight loss fevers or chills or systemic symptoms.  At that time, my plan was: Begin by obtaining some baseline lab work including a CBC, CMP, and lipase.  Differential diagnosis includes irritable bowel syndrome, gastroparesis, gastric outlet obstruction/stricture, etc.  I will try the patient empirically on Reglan 10 mg before meals and reassess the patient via telephone in 1 week to see if her symptoms have improved.  Also obtain gastric emptying study to evaluate for gastroparesis or obstruction.  If symptoms do not improve and initial work-up is negative, consider CT scan of the abdomen and pelvis versus EGD  11/25/18 Initial lab work revealed an elevated calcium level of 11.2.  It also revealed mild elevations in her liver function test with AST and ALT in the 60s and 70s.  Due to  the elevated calcium I held the patient's hydrochlorothiazide.  Also checked a parathyroid hormone level which was mildly elevated at 86 consistent with hyperparathyroidism.  We temporarily discontinued her pravastatin and then recheck her lab work in a week.  Off the hydrochlorothiazide, her calcium levels normalized and have been normal the last 2 times we have checked them despite the elevated parathyroid level.  Her liver function test also improved dramatically and dropped to 31 and 41 respectively.  However the patient's blood pressure is elevated and given her history of prediabetes, the patient would benefit from being on a statin.  I did obtain a right upper quadrant ultrasound as a work-up for her elevated liver function test.  Right upper quadrant ultrasound revealed hepatic steatosis.  It also revealed biliary sludge.  Patient denies any right upper quadrant pain.  Furthermore since I last saw the patient her bloating and satiety have completely resolved.  She feels fine now.  She had to discontinue Reglan due to diarrhea and despite stopping the Reglan her symptoms continue to be resolved Past Medical History:  Diagnosis Date   Diabetes mellitus type II, controlled ([place])    Hyperlipidemia    Hypertension    Past Surgical History:  Procedure Laterality Date   TUBAL LIGATION     Current Outpatient Medications on File Prior to Visit  Medication Sig Dispense Refill   atenolol (TENORMIN) 25 MG tablet TAKE 1 TABLET BY MOUTH EVERY DAY 90 tablet 2   FLUoxetine (PROZAC) 20 MG capsule  TAKE 1 CAPSULE BY MOUTH EVERY DAY 90 capsule 3   losartan (COZAAR) 100 MG tablet Take 0.5 tablets (50 mg total) by mouth daily. 45 tablet 3   Multiple Vitamins-Minerals (MULTIVITAMIN,TX-MINERALS) tablet Take 1 tablet by mouth daily.       omeprazole (PRILOSEC) 20 MG capsule TAKE 1 CAPSULE BY MOUTH EVERY DAY 90 capsule 3   VENTOLIN HFA 108 (90 Base) MCG/ACT inhaler INHALE 2 PUFFS INTO THE LUNGS EVERY 6  HOURS AS NEEDED FOR WHEEZING OR SHORTNESS OF BREATH. 18 Inhaler 3   pravastatin (PRAVACHOL) 40 MG tablet TAKE 1 TABLET BY MOUTH EVERY DAY (Patient not taking: Reported on 11/25/2018) 90 tablet 3   No current facility-administered medications on file prior to visit.    No Known Allergies Social History   Socioeconomic History   Marital status: Married    Spouse name: Not on file   Number of children: Not on file   Years of education: Not on file   Highest education level: Not on file  Occupational History   Not on file  Social Needs   Financial resource strain: Not on file   Food insecurity    Worry: Not on file    Inability: Not on file   Transportation needs    Medical: Not on file    Non-medical: Not on file  Tobacco Use   Smoking status: Never Smoker   Smokeless tobacco: Never Used  Substance and Sexual Activity   Alcohol use: Yes    Comment: glass wine once a week   Drug use: No   Sexual activity: Not on file  Lifestyle   Physical activity    Days per week: Not on file    Minutes per session: Not on file   Stress: Not on file  Relationships   Social connections    Talks on phone: Not on file    Gets together: Not on file    Attends religious service: Not on file    Active member of club or organization: Not on file    Attends meetings of clubs or organizations: Not on file    Relationship status: Not on file   Intimate partner violence    Fear of current or ex partner: Not on file    Emotionally abused: Not on file    Physically abused: Not on file    Forced sexual activity: Not on file  Other Topics Concern   Not on file  Social History Narrative   Not on file      Review of Systems  All other systems reviewed and are negative.      Objective:   Physical Exam  Constitutional: She appears well-developed and well-nourished.  Cardiovascular: Normal rate, regular rhythm and normal heart sounds.  Pulmonary/Chest: Effort normal and  breath sounds normal. No respiratory distress. She has no wheezes. She has no rales.  Abdominal: Soft. Bowel sounds are normal. She exhibits no distension and no mass. There is no abdominal tenderness. There is no rebound and no guarding.  Musculoskeletal:        General: No edema.  Vitals reviewed.      Assessment & Plan:  The primary encounter diagnosis was Elevated LFTs. Diagnoses of Serum calcium elevated, Essential hypertension, and Pure hypercholesterolemia were also pertinent to this visit.   Patient is now asymptomatic.  I believe her elevated calcium level was due to a combination of hydrochlorothiazide and mild hyperparathyroidism.  We will keep the patient off the hydrochlorothiazide and recheck  labs in 6 weeks to monitor her calcium level.  As long as normal I do not see an indication for surgery to remove the parathyroid glands but if her calcium levels remain elevated I would consult general surgery.  However since stopping hydrochlorothiazide her blood pressure is elevated so I will add Cardura 2 mg a day and recheck blood pressure via telephone in 3 weeks.  I believe her elevated liver function tests were due to a combination of hepatic steatosis coupled with her pravastatin.  I have recommended therapeutic lifestyle changes and we will replace pravastatin with Crestor 5 mg and recheck a CMP and fasting lipid panel in 6 weeks.  Patient's bloating and early satiety have completely resolved.  She denies any symptoms of biliary colic so we will not consult general surgery due to her biliary sludge.  Patient is instructed to monitor for any right upper quadrant pain and to notify me immediately if she develops this.  However at the present time she is asymptomatic and so no further treatment will be necessary.  Recheck lab work in 6 weeks.

## 2018-12-05 ENCOUNTER — Other Ambulatory Visit: Payer: Self-pay | Admitting: Family Medicine

## 2018-12-06 ENCOUNTER — Other Ambulatory Visit: Payer: Self-pay

## 2018-12-09 ENCOUNTER — Ambulatory Visit (INDEPENDENT_AMBULATORY_CARE_PROVIDER_SITE_OTHER): Payer: Medicare Other | Admitting: Family Medicine

## 2018-12-09 ENCOUNTER — Other Ambulatory Visit: Payer: Self-pay

## 2018-12-09 ENCOUNTER — Encounter: Payer: Self-pay | Admitting: Family Medicine

## 2018-12-09 VITALS — BP 220/98 | HR 56 | Temp 98.7°F | Resp 16 | Ht 60.0 in | Wt 171.0 lb

## 2018-12-09 DIAGNOSIS — I160 Hypertensive urgency: Secondary | ICD-10-CM | POA: Diagnosis not present

## 2018-12-09 MED ORDER — LOSARTAN POTASSIUM 100 MG PO TABS: 100.0000 mg | ORAL_TABLET | Freq: Every day | ORAL | 3 refills | Status: DC

## 2018-12-09 MED ORDER — AMLODIPINE BESYLATE 10 MG PO TABS: 10.0000 mg | ORAL_TABLET | Freq: Every day | ORAL | 3 refills | Status: DC

## 2018-12-09 NOTE — Progress Notes (Signed)
Subjective:    Patient ID: Katie Lutz, female    DOB: [birth year], 68 y.o.   MRN: [id number]  HPI  11/11/18 Patient presents with gradually worsening early satiety and bloating.  She states that she feels extremely full and bloated and nauseated even 1-1/2 hours after eating no matter what type of food she eats.  She states that she feels pregnant.  Her abdomen always feels full and distended.  She denies any dysphasia.  She denies any odynophagia.  She does have acid reflux.  She will occasionally burp even an hour after eating and regurgitate undigested food.  However she denies any constipation.  She has 1 or 2 bowel movements every day that are soft and normal in consistency with no melena or hematochezia.  She denies any bilious vomiting.  She denies any abdominal pain although she does report constant bloating and distention.  Past medical history significant for borderline diabetes mellitus however she has no history of gastroparesis.  She denies any right upper quadrant pain or biliary colic.  She denies any alcohol use or history of pancreatic issues.  No specific food seems to trigger this.  She denies any weight loss fevers or chills or systemic symptoms.  At that time, my plan was: Begin by obtaining some baseline lab work including a CBC, CMP, and lipase.  Differential diagnosis includes irritable bowel syndrome, gastroparesis, gastric outlet obstruction/stricture, etc.  I will try the patient empirically on Reglan 10 mg before meals and reassess the patient via telephone in 1 week to see if her symptoms have improved.  Also obtain gastric emptying study to evaluate for gastroparesis or obstruction.  If symptoms do not improve and initial work-up is negative, consider CT scan of the abdomen and pelvis versus EGD  11/25/18 Initial lab work revealed an elevated calcium level of 11.2.  It also revealed mild elevations in her liver function test with AST and ALT in the 60s and 70s.  Due to  the elevated calcium I held the patient's hydrochlorothiazide.  Also checked a parathyroid hormone level which was mildly elevated at 86 consistent with hyperparathyroidism.  We temporarily discontinued her pravastatin and then recheck her lab work in a week.  Off the hydrochlorothiazide, her calcium levels normalized and have been normal the last 2 times we have checked them despite the elevated parathyroid level.  Her liver function test also improved dramatically and dropped to 31 and 41 respectively.  However the patient's blood pressure is elevated and given her history of prediabetes, the patient would benefit from being on a statin.  I did obtain a right upper quadrant ultrasound as a work-up for her elevated liver function test.  Right upper quadrant ultrasound revealed hepatic steatosis.  It also revealed biliary sludge.  Patient denies any right upper quadrant pain.  Furthermore since I last saw the patient her bloating and satiety have completely resolved.  She feels fine now.  She had to discontinue Reglan due to diarrhea and despite stopping the Reglan her symptoms continue to be resolved.  At that time, my plan was: Patient is now asymptomatic.  I believe her elevated calcium level was due to a combination of hydrochlorothiazide and mild hyperparathyroidism.  We will keep the patient off the hydrochlorothiazide and recheck labs in 6 weeks to monitor her calcium level.  As long as normal I do not see an indication for surgery to remove the parathyroid glands but if her calcium levels remain elevated I would consult general  surgery.  However since stopping hydrochlorothiazide her blood pressure is elevated so I will add Cardura 2 mg a day and recheck blood pressure via telephone in 3 weeks.  I believe her elevated liver function tests were due to a combination of hepatic steatosis coupled with her pravastatin.  I have recommended therapeutic lifestyle changes and we will replace pravastatin with Crestor  5 mg and recheck a CMP and fasting lipid panel in 6 weeks.  Patient's bloating and early satiety have completely resolved.  She denies any symptoms of biliary colic so we will not consult general surgery due to her biliary sludge.  Patient is instructed to monitor for any right upper quadrant pain and to notify me immediately if she develops this.  However at the present time she is asymptomatic and so no further treatment will be necessary.  Recheck lab work in 6 weeks.  12/09/18 Patient is unable to take doxazosin because it is giving her a headache.  However when she checks her blood pressure she is seeing her blood pressure at home between 094 and 709 systolic.  Diastolic blood pressure has been in the 80s.  She reports a mild headache occasionally throughout the day.  She denies any strokelike symptoms.  She denies any blurry vision.  She denies any chest pain or shortness of breath or dyspnea on exertion.  There is no pitting edema on her physical exam today.  There is no JVD.  Lungs are clear to auscultation bilaterally.  Cranial nerves II through XII are grossly intact with muscle strength 5/5 equal and symmetric in the upper and lower extremities.  Patient denies any tachycardia or palpitations to suggest a pheochromocytoma. Past Medical History:  Diagnosis Date   Diabetes mellitus type II, controlled ([place])    Hyperlipidemia    Hypertension    Past Surgical History:  Procedure Laterality Date   TUBAL LIGATION     Current Outpatient Medications on File Prior to Visit  Medication Sig Dispense Refill   atenolol (TENORMIN) 25 MG tablet TAKE 1 TABLET BY MOUTH EVERY DAY 90 tablet 2   doxazosin (CARDURA) 2 MG tablet Take 1 tablet (2 mg total) by mouth daily. 30 tablet 2   FLUoxetine (PROZAC) 20 MG capsule TAKE 1 CAPSULE BY MOUTH EVERY DAY 90 capsule 3   losartan (COZAAR) 100 MG tablet Take 0.5 tablets (50 mg total) by mouth daily. 45 tablet 3   Multiple Vitamins-Minerals  (MULTIVITAMIN,TX-MINERALS) tablet Take 1 tablet by mouth daily.       omeprazole (PRILOSEC) 20 MG capsule TAKE 1 CAPSULE BY MOUTH EVERY DAY 90 capsule 3   pravastatin (PRAVACHOL) 40 MG tablet TAKE 1 TABLET BY MOUTH EVERY DAY (Patient not taking: Reported on 11/25/2018) 90 tablet 3   rosuvastatin (CRESTOR) 5 MG tablet Take 1 tablet (5 mg total) by mouth daily. 90 tablet 3   VENTOLIN HFA 108 (90 Base) MCG/ACT inhaler INHALE 2 PUFFS INTO THE LUNGS EVERY 6 HOURS AS NEEDED FOR WHEEZING OR SHORTNESS OF BREATH. 18 Inhaler 3   No current facility-administered medications on file prior to visit.    No Known Allergies Social History   Socioeconomic History   Marital status: Married    Spouse name: Not on file   Number of children: Not on file   Years of education: Not on file   Highest education level: Not on file  Occupational History   Not on file  Social Needs   Financial resource strain: Not on file   Food  insecurity    Worry: Not on file    Inability: Not on file   Transportation needs    Medical: Not on file    Non-medical: Not on file  Tobacco Use   Smoking status: Never Smoker   Smokeless tobacco: Never Used  Substance and Sexual Activity   Alcohol use: Yes    Comment: glass wine once a week   Drug use: No   Sexual activity: Not on file  Lifestyle   Physical activity    Days per week: Not on file    Minutes per session: Not on file   Stress: Not on file  Relationships   Social connections    Talks on phone: Not on file    Gets together: Not on file    Attends religious service: Not on file    Active member of club or organization: Not on file    Attends meetings of clubs or organizations: Not on file    Relationship status: Not on file   Intimate partner violence    Fear of current or ex partner: Not on file    Emotionally abused: Not on file    Physically abused: Not on file    Forced sexual activity: Not on file  Other Topics Concern   Not on  file  Social History Narrative   Not on file      Review of Systems  All other systems reviewed and are negative.      Objective:   Physical Exam  Constitutional: She is oriented to person, place, and time. She appears well-developed and well-nourished. No distress.  Neck: No JVD present.  Cardiovascular: Normal rate and regular rhythm. Exam reveals no gallop and no friction rub.  No murmur heard. Pulmonary/Chest: Effort normal and breath sounds normal. No respiratory distress. She has no wheezes. She has no rales.  Abdominal: Soft. Bowel sounds are normal. She exhibits no distension and no mass. There is no abdominal tenderness. There is no rebound and no guarding.  Musculoskeletal:        General: No edema.  Neurological: She is alert and oriented to person, place, and time. No cranial nerve deficit. She exhibits normal muscle tone. Coordination normal.  Skin: She is not diaphoretic.  Vitals reviewed.      Assessment & Plan:   The encounter diagnosis was Hypertensive urgency. There is no evidence of hypertensive emergency as there is no endorgan damage.  She is asymptomatic.  However, her blood pressure is extremely high.  Therefore I want the patient to add amlodipine 10 mg a day immediately and increase losartan to 100 mg a day and then recheck blood pressure in 3 days.  Gradually add medication as needed to get systolic blood pressure ultimately to a goal under 140.  Recommended no salt.  Recommended no strenuous activity or exercise until blood pressure is under better control.

## 2018-12-10 ENCOUNTER — Other Ambulatory Visit: Payer: Self-pay | Admitting: Family Medicine

## 2018-12-10 MED ORDER — LOSARTAN POTASSIUM 100 MG PO TABS: 100.0000 mg | ORAL_TABLET | Freq: Every day | ORAL | 3 refills | Status: AC

## 2019-01-03 ENCOUNTER — Other Ambulatory Visit: Payer: Self-pay

## 2019-01-06 ENCOUNTER — Encounter: Payer: Self-pay | Admitting: Family Medicine

## 2019-01-06 ENCOUNTER — Other Ambulatory Visit: Payer: Self-pay

## 2019-01-06 ENCOUNTER — Ambulatory Visit (INDEPENDENT_AMBULATORY_CARE_PROVIDER_SITE_OTHER): Payer: Medicare Other | Admitting: Family Medicine

## 2019-01-06 VITALS — BP 160/78 | HR 54 | Temp 98.0°F | Resp 14 | Ht 60.0 in | Wt 172.0 lb

## 2019-01-06 DIAGNOSIS — I10 Essential (primary) hypertension: Secondary | ICD-10-CM

## 2019-01-06 DIAGNOSIS — Z23 Encounter for immunization: Secondary | ICD-10-CM | POA: Diagnosis not present

## 2019-01-06 NOTE — Progress Notes (Signed)
Subjective:    Patient ID: Katie Lutz, female    DOB: [birth year], 68 y.o.   MRN: [id number]  HPI  11/11/18 Patient presents with gradually worsening early satiety and bloating.  She states that she feels extremely full and bloated and nauseated even 1-1/2 hours after eating no matter what type of food she eats.  She states that she feels pregnant.  Her abdomen always feels full and distended.  She denies any dysphasia.  She denies any odynophagia.  She does have acid reflux.  She will occasionally burp even an hour after eating and regurgitate undigested food.  However she denies any constipation.  She has 1 or 2 bowel movements every day that are soft and normal in consistency with no melena or hematochezia.  She denies any bilious vomiting.  She denies any abdominal pain although she does report constant bloating and distention.  Past medical history significant for borderline diabetes mellitus however she has no history of gastroparesis.  She denies any right upper quadrant pain or biliary colic.  She denies any alcohol use or history of pancreatic issues.  No specific food seems to trigger this.  She denies any weight loss fevers or chills or systemic symptoms.  At that time, my plan was: Begin by obtaining some baseline lab work including a CBC, CMP, and lipase.  Differential diagnosis includes irritable bowel syndrome, gastroparesis, gastric outlet obstruction/stricture, etc.  I will try the patient empirically on Reglan 10 mg before meals and reassess the patient via telephone in 1 week to see if her symptoms have improved.  Also obtain gastric emptying study to evaluate for gastroparesis or obstruction.  If symptoms do not improve and initial work-up is negative, consider CT scan of the abdomen and pelvis versus EGD  11/25/18 Initial lab work revealed an elevated calcium level of 11.2.  It also revealed mild elevations in her liver function test with AST and ALT in the 60s and 70s.  Due to  the elevated calcium I held the patient's hydrochlorothiazide.  Also checked a parathyroid hormone level which was mildly elevated at 86 consistent with hyperparathyroidism.  We temporarily discontinued her pravastatin and then recheck her lab work in a week.  Off the hydrochlorothiazide, her calcium levels normalized and have been normal the last 2 times we have checked them despite the elevated parathyroid level.  Her liver function test also improved dramatically and dropped to 31 and 41 respectively.  However the patient's blood pressure is elevated and given her history of prediabetes, the patient would benefit from being on a statin.  I did obtain a right upper quadrant ultrasound as a work-up for her elevated liver function test.  Right upper quadrant ultrasound revealed hepatic steatosis.  It also revealed biliary sludge.  Patient denies any right upper quadrant pain.  Furthermore since I last saw the patient her bloating and satiety have completely resolved.  She feels fine now.  She had to discontinue Reglan due to diarrhea and despite stopping the Reglan her symptoms continue to be resolved.  At that time, my plan was: Patient is now asymptomatic.  I believe her elevated calcium level was due to a combination of hydrochlorothiazide and mild hyperparathyroidism.  We will keep the patient off the hydrochlorothiazide and recheck labs in 6 weeks to monitor her calcium level.  As long as normal I do not see an indication for surgery to remove the parathyroid glands but if her calcium levels remain elevated I would consult general  surgery.  However since stopping hydrochlorothiazide her blood pressure is elevated so I will add Cardura 2 mg a day and recheck blood pressure via telephone in 3 weeks.  I believe her elevated liver function tests were due to a combination of hepatic steatosis coupled with her pravastatin.  I have recommended therapeutic lifestyle changes and we will replace pravastatin with Crestor  5 mg and recheck a CMP and fasting lipid panel in 6 weeks.  Patient's bloating and early satiety have completely resolved.  She denies any symptoms of biliary colic so we will not consult general surgery due to her biliary sludge.  Patient is instructed to monitor for any right upper quadrant pain and to notify me immediately if she develops this.  However at the present time she is asymptomatic and so no further treatment will be necessary.  Recheck lab work in 6 weeks.  12/09/18 Patient is unable to take doxazosin because it is giving her a headache.  However when she checks her blood pressure she is seeing her blood pressure at home between [id number] and [id number] systolic.  Diastolic blood pressure has been in the 80s.  She reports a mild headache occasionally throughout the day.  She denies any strokelike symptoms.  She denies any blurry vision.  She denies any chest pain or shortness of breath or dyspnea on exertion.  There is no pitting edema on her physical exam today.  There is no JVD.  Lungs are clear to auscultation bilaterally.  Cranial nerves II through XII are grossly intact with muscle strength 5/5 equal and symmetric in the upper and lower extremities.  Patient denies any tachycardia or palpitations to suggest a pheochromocytoma.  At that time, my plan was: There is no evidence of hypertensive emergency as there is no endorgan damage.  She is asymptomatic.  However, her blood pressure is extremely high.  Therefore I want the patient to add amlodipine 10 mg a day immediately and increase losartan to 100 mg a day and then recheck blood pressure in 3 days.  Gradually add medication as needed to get systolic blood pressure ultimately to a goal under 140.  Recommended no salt.  Recommended no strenuous activity or exercise until blood pressure is under better control.  01/06/19 The patient's blood pressure has improved.  She states that her systolic blood pressures have dropped to 140-160 which is much better  than last time.  However they are still too high.  She denies any side effects from the medication per tickly there is no edema on her exam today.  In the past she could not tolerate Cardura due to a headache however my belief is the headache was likely due to her high blood pressure.  She has bradycardia and although asymptomatic, this prevents [place] from increasing her atenolol.  Overall she is doing well with no side effects. Past Medical History:  Diagnosis Date   Diabetes mellitus type II, controlled ([place])    Hyperlipidemia    Hypertension    Past Surgical History:  Procedure Laterality Date   TUBAL LIGATION     Current Outpatient Medications on File Prior to Visit  Medication Sig Dispense Refill   amLODipine (NORVASC) 10 MG tablet Take 1 tablet (10 mg total) by mouth daily. 90 tablet 3   atenolol (TENORMIN) 25 MG tablet TAKE 1 TABLET BY MOUTH EVERY DAY 90 tablet 2   doxazosin (CARDURA) 2 MG tablet Take 1 tablet (2 mg total) by mouth daily. (Patient not taking: Reported on  12/09/2018) 30 tablet 2   FLUoxetine (PROZAC) 20 MG capsule TAKE 1 CAPSULE BY MOUTH EVERY DAY 90 capsule 3   losartan (COZAAR) 100 MG tablet Take 1 tablet (100 mg total) by mouth daily. 90 tablet 3   Multiple Vitamins-Minerals (MULTIVITAMIN,TX-MINERALS) tablet Take 1 tablet by mouth daily.       omeprazole (PRILOSEC) 20 MG capsule TAKE 1 CAPSULE BY MOUTH EVERY DAY 90 capsule 3   rosuvastatin (CRESTOR) 5 MG tablet Take 1 tablet (5 mg total) by mouth daily. 90 tablet 3   VENTOLIN HFA 108 (90 Base) MCG/ACT inhaler INHALE 2 PUFFS INTO THE LUNGS EVERY 6 HOURS AS NEEDED FOR WHEEZING OR SHORTNESS OF BREATH. 18 Inhaler 3   No current facility-administered medications on file prior to visit.    No Known Allergies Social History   Socioeconomic History   Marital status: Married    Spouse name: Not on file   Number of children: Not on file   Years of education: Not on file   Highest education level: Not on  file  Occupational History   Not on file  Social Needs   Financial resource strain: Not on file   Food insecurity    Worry: Not on file    Inability: Not on file   Transportation needs    Medical: Not on file    Non-medical: Not on file  Tobacco Use   Smoking status: Never Smoker   Smokeless tobacco: Never Used  Substance and Sexual Activity   Alcohol use: Yes    Comment: glass wine once a week   Drug use: No   Sexual activity: Not on file  Lifestyle   Physical activity    Days per week: Not on file    Minutes per session: Not on file   Stress: Not on file  Relationships   Social connections    Talks on phone: Not on file    Gets together: Not on file    Attends religious service: Not on file    Active member of club or organization: Not on file    Attends meetings of clubs or organizations: Not on file    Relationship status: Not on file   Intimate partner violence    Fear of current or ex partner: Not on file    Emotionally abused: Not on file    Physically abused: Not on file    Forced sexual activity: Not on file  Other Topics Concern   Not on file  Social History Narrative   Not on file      Review of Systems  All other systems reviewed and are negative.      Objective:   Physical Exam  Constitutional: She is oriented to person, place, and time. She appears well-developed and well-nourished. No distress.  Neck: No JVD present.  Cardiovascular: Normal rate and regular rhythm. Exam reveals no gallop and no friction rub.  No murmur heard. Pulmonary/Chest: Effort normal and breath sounds normal. No respiratory distress. She has no wheezes. She has no rales.  Abdominal: Soft. Bowel sounds are normal. She exhibits no distension and no mass. There is no abdominal tenderness. There is no rebound and no guarding.  Musculoskeletal:        General: No edema.  Neurological: She is alert and oriented to person, place, and time. No cranial nerve  deficit. She exhibits normal muscle tone. Coordination normal.  Skin: She is not diaphoretic.  Vitals reviewed.      Assessment &  Plan:   Benign essential HTN - Plan: COMPLETE METABOLIC PANEL WITH GFR  I have recommended that we retry Cardura 2 mg a day and recheck via telephone in 2 weeks to see how her blood pressures doing.  If her blood pressure is still extremely high, I would recommend looking for secondary causes of hypertension, particular renal artery stenosis.  If the blood pressure is improving I would increase the Cardura and discontinue the atenolol due to her bradycardia.  If the patient gets a headache from the Cardura, we may have to consider clonidine versus hydralazine.  She cannot tolerate hydrochlorothiazide due to hypercalcemia.  Her PTH level is also high at 86.  Therefore I will recheck her calcium level today.  If calcium levels have returned to being high, we may need to consider surgical consultation for hyperparathyroidism.

## 2019-01-06 NOTE — Addendum Note (Signed)
Addended by: Shary Decamp B on: 01/06/2019 09:19 AM   Modules accepted: Orders

## 2019-01-07 LAB — COMPLETE METABOLIC PANEL WITH GFR
AG Ratio: 1.4 (calc) (ref 1.0–2.5)
ALT: 30 U/L — ABNORMAL HIGH (ref 6–29)
AST: 24 U/L (ref 10–35)
Albumin: 4.2 g/dL (ref 3.6–5.1)
Alkaline phosphatase (APISO): 43 U/L (ref 37–153)
BUN: 13 mg/dL (ref 7–25)
CO2: 26 mmol/L (ref 20–32)
Calcium: 10.4 mg/dL (ref 8.6–10.4)
Chloride: 106 mmol/L (ref 98–110)
Creat: 0.83 mg/dL (ref 0.60–0.93)
GFR, Est African American: 83 mL/min/{1.73_m2} (ref >=60)
GFR, Est Non African American: 71 mL/min/{1.73_m2} (ref >=60)
Globulin: 3 g/dL (calc) (ref 1.9–3.7)
Glucose, Bld: 124 mg/dL — ABNORMAL HIGH (ref 65–99)
Potassium: 4.1 mmol/L (ref 3.5–5.3)
Sodium: 139 mmol/L (ref 135–146)
Total Bilirubin: 0.5 mg/dL (ref 0.2–1.2)
Total Protein: 7.2 g/dL (ref 6.1–8.1)

## 2019-02-27 ENCOUNTER — Other Ambulatory Visit: Payer: Self-pay | Admitting: Family Medicine

## 2019-03-02 ENCOUNTER — Other Ambulatory Visit: Payer: Self-pay | Admitting: Family Medicine

## 2019-03-26 ENCOUNTER — Other Ambulatory Visit: Payer: Self-pay | Admitting: Family Medicine

## 2019-03-26 DIAGNOSIS — J209 Acute bronchitis, unspecified: Secondary | ICD-10-CM

## 2019-04-02 ENCOUNTER — Other Ambulatory Visit: Payer: Self-pay

## 2019-05-17 DIAGNOSIS — Z20828 Contact with and (suspected) exposure to other viral communicable diseases: Secondary | ICD-10-CM | POA: Diagnosis not present

## 2019-08-15 ENCOUNTER — Other Ambulatory Visit: Payer: Self-pay | Admitting: Family Medicine

## 2019-09-25 DIAGNOSIS — E785 Hyperlipidemia, unspecified: Secondary | ICD-10-CM | POA: Diagnosis not present

## 2019-09-25 DIAGNOSIS — J302 Other seasonal allergic rhinitis: Secondary | ICD-10-CM | POA: Diagnosis not present

## 2019-09-25 DIAGNOSIS — K219 Gastro-esophageal reflux disease without esophagitis: Secondary | ICD-10-CM | POA: Diagnosis not present

## 2019-09-25 DIAGNOSIS — M1990 Unspecified osteoarthritis, unspecified site: Secondary | ICD-10-CM | POA: Diagnosis not present

## 2019-09-25 DIAGNOSIS — Z6833 Body mass index (BMI) 33.0-33.9, adult: Secondary | ICD-10-CM | POA: Diagnosis not present

## 2019-09-25 DIAGNOSIS — Z1331 Encounter for screening for depression: Secondary | ICD-10-CM | POA: Diagnosis not present

## 2019-09-25 DIAGNOSIS — Z1389 Encounter for screening for other disorder: Secondary | ICD-10-CM | POA: Diagnosis not present

## 2019-09-25 DIAGNOSIS — J45909 Unspecified asthma, uncomplicated: Secondary | ICD-10-CM | POA: Diagnosis not present

## 2019-09-25 DIAGNOSIS — R7303 Prediabetes: Secondary | ICD-10-CM | POA: Diagnosis not present

## 2019-09-25 DIAGNOSIS — F419 Anxiety disorder, unspecified: Secondary | ICD-10-CM | POA: Diagnosis not present

## 2019-09-25 DIAGNOSIS — I10 Essential (primary) hypertension: Secondary | ICD-10-CM | POA: Diagnosis not present

## 2019-12-16 DIAGNOSIS — Z961 Presence of intraocular lens: Secondary | ICD-10-CM | POA: Diagnosis not present

## 2019-12-16 DIAGNOSIS — H268 Other specified cataract: Secondary | ICD-10-CM | POA: Diagnosis not present

## 2019-12-16 DIAGNOSIS — H04129 Dry eye syndrome of unspecified lacrimal gland: Secondary | ICD-10-CM | POA: Diagnosis not present

## 2020-01-02 DIAGNOSIS — K219 Gastro-esophageal reflux disease without esophagitis: Secondary | ICD-10-CM | POA: Diagnosis not present

## 2020-01-02 DIAGNOSIS — E785 Hyperlipidemia, unspecified: Secondary | ICD-10-CM | POA: Diagnosis not present

## 2020-01-02 DIAGNOSIS — I10 Essential (primary) hypertension: Secondary | ICD-10-CM | POA: Diagnosis not present

## 2020-01-02 DIAGNOSIS — F419 Anxiety disorder, unspecified: Secondary | ICD-10-CM | POA: Diagnosis not present

## 2020-01-02 DIAGNOSIS — J45909 Unspecified asthma, uncomplicated: Secondary | ICD-10-CM | POA: Diagnosis not present

## 2020-01-21 DIAGNOSIS — Z23 Encounter for immunization: Secondary | ICD-10-CM | POA: Diagnosis not present

## 2020-02-06 DIAGNOSIS — Z23 Encounter for immunization: Secondary | ICD-10-CM | POA: Diagnosis not present

## 2020-12-14 IMAGING — NM NUCLEAR MEDICINE GASTRIC EMPTYING STUDY
4 series · 4 of 4 positions shown · non-contrast
Comparison: Abdominal ultrasound 11/21/2018

CLINICAL DATA: Abdominal pain with bloating and nausea for 6 months

EXAM:
NUCLEAR MEDICINE GASTRIC EMPTYING SCAN
TECHNIQUE: After oral ingestion of radiolabeled meal, sequential abdominal
images were obtained for 4 hours. Percentage of activity emptying
the stomach was calculated at 1 hour, 2 hour, 3 hour, and 4 hours.
RADIOPHARMACEUTICALS:  2 mCi Tc-SSm sulfur colloid in standardized
meal

[Series 1: 0 min · 4.14mm/px · 1 of 1 slices shown]
[im 1/1]
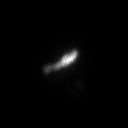

[Series 2: 1 hr · 4.14mm/px · 1 of 1 slices shown]
[im 1/1]
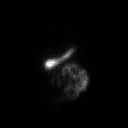

[Series 3: 2 hr · 4.14mm/px · 1 of 1 slices shown]
[im 1/1]
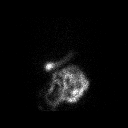

[Series 4: 3 hr · 4.14mm/px · 1 of 1 slices shown]
[im 1/1]
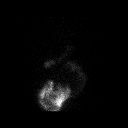

[4 of 4 positions shown; findings below may reference images not displayed]

FINDINGS: Expected location of the stomach in the left upper quadrant.
Ingested meal empties the stomach gradually over the course of the
study.

52% emptied at 1 hr ( normal >= 10%)

83% emptied at 2 hr ( normal >= 40%)

92% emptied at 3 hr ( normal >= 70%)
IMPRESSION: Normal gastric emptying study.

## 2021-02-25 ENCOUNTER — Telehealth: Payer: Self-pay | Admitting: *Deleted

## 2021-02-25 DIAGNOSIS — Z1212 Encounter for screening for malignant neoplasm of rectum: Secondary | ICD-10-CM

## 2021-02-25 DIAGNOSIS — Z1211 Encounter for screening for malignant neoplasm of colon: Secondary | ICD-10-CM

## 2021-02-25 NOTE — Telephone Encounter (Signed)
Patient due for Cologuard re-screen.   Order placed via [facility].  

## 2021-09-15 LAB — COLOGUARD: COLOGUARD: NEGATIVE

## 2021-10-06 ENCOUNTER — Telehealth: Payer: Self-pay

## 2021-10-06 NOTE — Telephone Encounter (Signed)
Return pt called she had received a call from the office regard results from her coloaguard advised pt that her result were good  for 3 years per  Dr. Dennard Schaumann.if she has any further concerns she can return our call. Left this information on patient vm.

## 2022-06-19 ENCOUNTER — Other Ambulatory Visit: Payer: Self-pay | Admitting: Family Medicine

## 2022-06-19 DIAGNOSIS — Z1231 Encounter for screening mammogram for malignant neoplasm of breast: Secondary | ICD-10-CM
